# Patient Record
Sex: Female | Born: 1995 | Race: White | Hispanic: Yes | Marital: Married | Smoking: Never smoker
Health system: Southern US, Community
[De-identification: ages and names within clinical notes are randomized; demographics above are authoritative.]

---
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
## 2018-09-15 NOTE — L&D Delivery Note (Signed)
Delivery Note  Date of delivery: 08/27/2019 Estimated Date of Delivery: 08/27/19 Patient's last menstrual period was 11/20/2018 (exact date). EGA: [redacted]w[redacted]d  Delivery Note At 5:12 AM a viable female was delivered via Vaginal, Spontaneous (Presentation: OA).  APGAR: 9, 9; weight  pending.   Placenta status: Spontaneous, Intact with trailing membranes.  Cord: 3 vessels with the following complications:  none.    First Stage: Labor onset: around 1130 Augmentation : AROM and Pitocin Analgesia /Anesthesia intrapartum: none AROM at 2145  [name] presented to L&D with early labor. She was augmented with AROM and pitocin.   Second Stage: Complete dilation at 0510 Onset of pushing at 0515 FHR second stage Cat 1 Delivery at 0512 on 08/27/2019  She progressed to complete quickly and had a spontaneous vaginal birth of a live female over an intact perineum. The fetal head was delivered in OA position with restitution to LOA. No nuchal cord. Anterior then posterior shoulders delivered spontaneously with minimal assistance. Baby placed on mom's abdomen and attended to by transition RN. Cord clamped and cut when pulseless by FOB. Cord blood obtained for newborn labs.  Third Stage: Placenta delivered intact with 3VC at 0523 Placenta disposition: Intact Uterine tone Firm / bleeding min IV pitocin given for hemorrhage prophylaxis  Anesthesia: None Episiotomy: None Lacerations: 1st degree Suture Repair: 3.0 vicryl rapide Est. Blood Loss (mL):    Mom to postpartum.  Baby to Couplet care / Skin to Skin.   Feeding planned: Breastfeeding   Rebecca Powell 08/27/2019, 5:57 AM       Rebecca Powell, CNM 08/27/2019 5:57 AM

## 2019-02-14 ENCOUNTER — Other Ambulatory Visit: Payer: Self-pay | Admitting: Family Medicine

## 2019-02-14 DIAGNOSIS — Z3490 Encounter for supervision of normal pregnancy, unspecified, unspecified trimester: Secondary | ICD-10-CM | POA: Insufficient documentation

## 2019-02-14 DIAGNOSIS — O3680X0 Pregnancy with inconclusive fetal viability, not applicable or unspecified: Secondary | ICD-10-CM

## 2019-02-14 DIAGNOSIS — O99210 Obesity complicating pregnancy, unspecified trimester: Secondary | ICD-10-CM | POA: Insufficient documentation

## 2019-02-14 LAB — HM PAP SMEAR: HM Pap smear: NEGATIVE

## 2019-02-14 LAB — HEPATIC FUNCTION PANEL: AST: 11 — AB (ref 13–35)

## 2019-02-14 LAB — OB RESULTS CONSOLE HGB/HCT, BLOOD
HCT: 36 (ref 29–41)
Hemoglobin: 12.5

## 2019-02-14 LAB — OB RESULTS CONSOLE HIV ANTIBODY (ROUTINE TESTING): HIV: NONREACTIVE

## 2019-02-14 LAB — BASIC METABOLIC PANEL: Creatinine: 0.6 (ref <=1.1)

## 2019-02-14 LAB — OB RESULTS CONSOLE PLATELET COUNT: Platelets: 287

## 2019-02-15 LAB — OB RESULTS CONSOLE ABO/RH: RH Type: POSITIVE

## 2019-02-15 LAB — OB RESULTS CONSOLE TSH: TSH: 0.553

## 2019-02-15 LAB — OB RESULTS CONSOLE ANTIBODY SCREEN: Antibody Screen: NEGATIVE

## 2019-02-15 LAB — OB RESULTS CONSOLE RPR: RPR: NONREACTIVE

## 2019-02-15 LAB — OB RESULTS CONSOLE HEPATITIS B SURFACE ANTIGEN: Hepatitis B Surface Ag: NEGATIVE

## 2019-02-24 ENCOUNTER — Other Ambulatory Visit: Payer: Self-pay | Admitting: Family Medicine

## 2019-02-24 ENCOUNTER — Ambulatory Visit
Admission: RE | Admit: 2019-02-24 | Discharge: 2019-02-24 | Disposition: A | Discharge status: Home / Self Care | Payer: Self-pay | Source: Ambulatory Visit | Attending: Family Medicine | Admitting: Family Medicine

## 2019-02-24 ENCOUNTER — Other Ambulatory Visit: Payer: Self-pay

## 2019-02-24 DIAGNOSIS — O3680X0 Pregnancy with inconclusive fetal viability, not applicable or unspecified: Secondary | ICD-10-CM

## 2019-03-11 DIAGNOSIS — R5081 Fever presenting with conditions classified elsewhere: Secondary | ICD-10-CM | POA: Insufficient documentation

## 2019-04-05 DIAGNOSIS — Z3490 Encounter for supervision of normal pregnancy, unspecified, unspecified trimester: Secondary | ICD-10-CM

## 2019-04-05 DIAGNOSIS — O99210 Obesity complicating pregnancy, unspecified trimester: Secondary | ICD-10-CM

## 2019-04-05 DIAGNOSIS — R5081 Fever presenting with conditions classified elsewhere: Secondary | ICD-10-CM

## 2019-04-05 LAB — GLUCOSE, 1 HOUR GESTATIONAL: Gestational Diabetes Screen: 75

## 2019-04-07 ENCOUNTER — Encounter: Payer: Self-pay | Admitting: Physician Assistant

## 2019-04-07 ENCOUNTER — Ambulatory Visit: Payer: Self-pay | Admitting: Physician Assistant

## 2019-04-07 ENCOUNTER — Other Ambulatory Visit: Payer: Self-pay

## 2019-04-07 DIAGNOSIS — Z3490 Encounter for supervision of normal pregnancy, unspecified, unspecified trimester: Secondary | ICD-10-CM

## 2019-04-07 NOTE — Progress Notes (Signed)
   PRENATAL VISIT NOTE  Subjective:  Rebecca Powell is a 23 y.o. G2P1001 at [redacted]w[redacted]d being seen today for ongoing prenatal care.  She is currently monitored for the following issues for this low-risk pregnancy and has Supervision of normal pregnancy, multiparous, antepartum; Fever presenting with conditions classified elsewhere; and Obesity complicating pregnancy on their problem list.  Patient reports no complaints.  Contractions: Not present. Vag. Bleeding: None.  Movement: Present. Denies leaking of fluid/ROM.   The following portions of the patient's history were reviewed and updated as appropriate: allergies, current medications, past family history, past medical history, past social history, past surgical history and problem list. Problem list updated.  Objective:   Vitals:   04/07/19 1318  BP: 109/69  Temp: 98.8 F (37.1 C)  Weight: 163 lb (73.9 kg)    Fetal Status: Fetal Heart Rate (bpm): 144 Fundal Height: 19 cm Movement: Present     General:  Alert, oriented and cooperative. Patient is in no acute distress.  Skin: Skin is warm and dry. No rash noted.   Cardiovascular: Normal heart rate noted  Respiratory: Normal respiratory effort, no problems with respiration noted  Abdomen: Soft, gravid, appropriate for gestational age.        Pelvic: Cervical exam deferred        Extremities: Normal range of motion.  Edema: None  Mental Status: Normal mood and affect. Normal behavior. Normal judgment and thought content.   Assessment and Plan:  Pregnancy: G2P1001 at [redacted]w[redacted]d  1. Encounter for supervision of normal pregnancy, antepartum, unspecified gravidity Enc pt to push fluids in light of heat advisory. Desires anat [place], will schedule. Declines quad screen today. Agrees to telehealth format for next prenatal visit.   Preterm labor symptoms and general obstetric precautions including but not limited to vaginal bleeding, contractions, leaking of fluid and fetal movement were  reviewed in detail with the patient. Please refer to After Visit Summary for other counseling recommendations.  Return in about 4 weeks (around 05/05/2019) for Routine prenatal care, Telehealth.  Future Appointments  Date Time Provider [place]  05/06/2019  2:00 PM [facility] PROVIDER [facility] None    Lora Havens, PA-C

## 2019-04-07 NOTE — Progress Notes (Signed)
In for visit; taking PNV; discussed quad screen testing-declines Debera Lat, RN

## 2019-04-12 ENCOUNTER — Telehealth: Payer: Self-pay

## 2019-04-12 ENCOUNTER — Other Ambulatory Visit: Payer: Self-pay | Admitting: Physician Assistant

## 2019-04-12 DIAGNOSIS — Z3482 Encounter for supervision of other normal pregnancy, second trimester: Secondary | ICD-10-CM

## 2019-04-12 NOTE — Telephone Encounter (Signed)
Call to client--informed, per A. Streilein, PA, of recommendation to take 81mg  ASA daily; discussed indication to reduce risk of developing preeclampsia;  Client reports has not received call yet about u/s appt.; informed will check and call back; called [facility] u/s scheduling-clarified ordering provider; they will call client and set up appt.

## 2019-04-19 ENCOUNTER — Ambulatory Visit
Admission: RE | Admit: 2019-04-19 | Discharge: 2019-04-19 | Disposition: A | Discharge status: Home / Self Care | Payer: Medicaid Other | Source: Ambulatory Visit | Attending: Physician Assistant | Admitting: Physician Assistant

## 2019-04-19 ENCOUNTER — Other Ambulatory Visit: Payer: Self-pay

## 2019-04-19 DIAGNOSIS — Z3482 Encounter for supervision of other normal pregnancy, second trimester: Secondary | ICD-10-CM | POA: Diagnosis present

## 2019-05-06 ENCOUNTER — Ambulatory Visit: Payer: Self-pay

## 2019-05-06 ENCOUNTER — Ambulatory Visit: Payer: Medicaid Other | Admitting: Family Medicine

## 2019-05-06 ENCOUNTER — Telehealth: Payer: Self-pay

## 2019-05-06 ENCOUNTER — Other Ambulatory Visit: Payer: Self-pay

## 2019-05-06 DIAGNOSIS — Z3490 Encounter for supervision of normal pregnancy, unspecified, unspecified trimester: Secondary | ICD-10-CM

## 2019-05-06 DIAGNOSIS — O99212 Obesity complicating pregnancy, second trimester: Secondary | ICD-10-CM

## 2019-05-06 NOTE — Telephone Encounter (Signed)
Phone call to [facility] [place] scheduler and appt scheduled for 05/10/2019 at 11 am (arrive 10:45 am). [place] at [facility]. Phone call to client with appt and address of [place] facility. [place] interpreted during call. Shona Needles, RN

## 2019-05-06 NOTE — Progress Notes (Signed)
Negative responses to Covid-19 screening questions and denies international travel for self or FOB since pregnant. Does not have thermometer, BP cuff or scales at home. [place] forms completed via phone today. 4 week MHC RV appt scheduled for 917/2020 and client aware. Client is not taking daily Aspirin and states did not know the dose. Counseled to purchase 81 mg strength Aspirin. Shona Needles, RN

## 2019-05-06 NOTE — Progress Notes (Signed)
   TELEPHONE OBSTETRICS VISIT ENCOUNTER NOTE  I connected with@ on 05/06/19 at  2:00 PM EDT by telephone at home and verified that I am speaking with the correct person using two identifiers.   I discussed the limitations, risks, security and privacy concerns of performing an evaluation and management service by telephone and the availability of in person appointments. I also discussed with the patient that there may be a patient responsible charge related to this service. The patient expressed understanding and agreed to proceed.  Subjective:  Rebecca Powell is a 23 y.o. G2P1001 at [redacted]w[redacted]d being followed for ongoing prenatal care.  She is currently monitored for the following issues for this low-risk pregnancy and has Supervision of normal pregnancy, multiparous, antepartum; Fever presenting with conditions classified elsewhere; and Obesity complicating pregnancy on their problem list.  Patient reports no complaints. Reports fetal movement. Denies any contractions, bleeding or leaking of fluid.   The following portions of the patient's history were reviewed and updated as appropriate: allergies, current medications, past family history, past medical history, past social history, past surgical history and problem list.   Objective:   General:  Alert, oriented and cooperative.   Mental Status: Normal mood and affect perceived. Normal judgment and thought content.  Rest of physical exam deferred due to type of encounter  Assessment and Plan:  Pregnancy: G2P1001 at [redacted]w[redacted]d  1. Encounter for supervision of normal pregnancy, antepartum, unspecified gravidity Ordered follow up US to view heart, spine and cord insertion Never got BP Cuff, RX written today for BP cuff- RN to fax.  Next visit at 28 wks for 3rd trimester labs.   2. Obesity affecting pregnancy in second trimester Encouraged ASA    Preterm labor symptoms and general obstetric precautions including but not limited to vaginal  bleeding, contractions, leaking of fluid and fetal movement were reviewed in detail with the patient.   I discussed the assessment and treatment plan with the patient. The patient was provided an opportunity to ask questions and all were answered. The patient agreed with the plan and demonstrated an understanding of the instructions. The patient was advised to call back or seek an in-person office evaluation/go to the hospital for any urgent or concerning symptoms.  Please refer to After Visit Summary for other counseling recommendations.   I provided 12 minutes of non-face-to-face time during this encounter.  No follow-ups on file.  Future Appointments  Date Time Provider [place]  06/02/2019  3:15 PM [facility] PROVIDER AC-MAT None    Caren Macadam, MD

## 2019-05-10 ENCOUNTER — Encounter (INDEPENDENT_AMBULATORY_CARE_PROVIDER_SITE_OTHER): Payer: Self-pay

## 2019-05-10 ENCOUNTER — Other Ambulatory Visit: Payer: Self-pay

## 2019-05-10 ENCOUNTER — Ambulatory Visit
Admission: RE | Admit: 2019-05-10 | Discharge: 2019-05-10 | Disposition: A | Discharge status: Home / Self Care | Payer: Medicaid Other | Source: Ambulatory Visit | Attending: Family Medicine | Admitting: Family Medicine

## 2019-05-10 DIAGNOSIS — Z3490 Encounter for supervision of normal pregnancy, unspecified, unspecified trimester: Secondary | ICD-10-CM | POA: Diagnosis present

## 2019-05-10 DIAGNOSIS — O99212 Obesity complicating pregnancy, second trimester: Secondary | ICD-10-CM | POA: Diagnosis present

## 2019-06-02 ENCOUNTER — Other Ambulatory Visit: Payer: Self-pay

## 2019-06-02 ENCOUNTER — Encounter: Payer: Self-pay | Admitting: Physician Assistant

## 2019-06-02 ENCOUNTER — Ambulatory Visit: Payer: Medicaid Other | Admitting: Physician Assistant

## 2019-06-02 VITALS — BP 122/59 | Temp 98.7°F | Wt 166.4 lb

## 2019-06-02 DIAGNOSIS — Z3490 Encounter for supervision of normal pregnancy, unspecified, unspecified trimester: Secondary | ICD-10-CM

## 2019-06-02 DIAGNOSIS — O99019 Anemia complicating pregnancy, unspecified trimester: Secondary | ICD-10-CM

## 2019-06-02 DIAGNOSIS — Z23 Encounter for immunization: Secondary | ICD-10-CM | POA: Diagnosis not present

## 2019-06-02 DIAGNOSIS — O99212 Obesity complicating pregnancy, second trimester: Secondary | ICD-10-CM

## 2019-06-02 DIAGNOSIS — R5081 Fever presenting with conditions classified elsewhere: Secondary | ICD-10-CM

## 2019-06-02 LAB — OB RESULTS CONSOLE RUBELLA ANTIBODY, IGM: Rubella: IMMUNE

## 2019-06-02 LAB — HIV ANTIBODY (ROUTINE TESTING W REFLEX): HIV 1&2 Ab, 4th Generation: NONREACTIVE

## 2019-06-02 MED ORDER — PREPLUS 27-1 MG PO TABS: 1.0000 | ORAL_TABLET | ORAL | 3 refills | Status: AC

## 2019-06-02 MED ORDER — FERROUS SULFATE 324 (65 FE) MG PO TBEC: 1.0000 | DELAYED_RELEASE_TABLET | Freq: Every day | ORAL | 0 refills | Status: AC

## 2019-06-02 NOTE — Progress Notes (Signed)
122/59 

## 2019-06-02 NOTE — Progress Notes (Signed)
Taking PNV and ASA QD, denies ED/hospital visits since last RV. 28 week labs and Tdap today. Needs Rx for PNV. Hgb 10.9. Patient treated for Anemia per standing order. Hal Morales, RN

## 2019-06-02 NOTE — Progress Notes (Signed)
   PRENATAL VISIT NOTE  Subjective:  Rebecca Powell is a 23 y.o. G2P1001 at [redacted]w[redacted]d being seen today for ongoing prenatal care.  She is currently monitored for the following issues for this low-risk pregnancy and has Supervision of normal pregnancy, multiparous, antepartum; Fever presenting with conditions classified elsewhere; and Obesity complicating pregnancy on their problem list.  Patient reports no complaints.  Contractions: Not present. Vag. Bleeding: None.  Movement: Present. Denies leaking of fluid/ROM.   The following portions of the patient's history were reviewed and updated as appropriate: allergies, current medications, past family history, past medical history, past social history, past surgical history and problem list. Problem list updated.  Objective:   Vitals:   06/02/19 1528  BP: (!) 122/59  Temp: 98.7 F (37.1 C)  Weight: 166 lb 6.4 oz (75.5 kg)    Fetal Status: Fetal Heart Rate (bpm): 144 Fundal Height: 27 cm Movement: Present     General:  Alert, oriented and cooperative. Patient is in no acute distress.  Skin: Skin is warm and dry. No rash noted.   Cardiovascular: Normal heart rate noted  Respiratory: Normal respiratory effort, no problems with respiration noted  Abdomen: Soft, gravid, appropriate for gestational age.  Pain/Pressure: Absent     Pelvic: Cervical exam deferred        Extremities: Normal range of motion.  Edema: None  Mental Status: Normal mood and affect. Normal behavior. Normal judgment and thought content.   Assessment and Plan:  Pregnancy: G2P1001 at [redacted]w[redacted]d  1. Encounter for supervision of normal pregnancy, antepartum, unspecified gravidity eRx refill PNV, Rx BP cuff to be faxed - HIV [place] [facility] - Glucose, 1 hour gestational - RPR - Tdap vaccine greater than or equal to 7yo IM - Hemoglobin, venipuncture - Prenatal Vit-Fe Fumarate-FA (PREPLUS) 27-1 MG TABS; Take 1 tablet by mouth 1 day or 1 dose for 1 dose.  Dispense: 100  tablet; Refill: 3  2. Obesity affecting pregnancy in second trimester Continue daily aspirin   Preterm labor symptoms and general obstetric precautions including but not limited to vaginal bleeding, contractions, leaking of fluid and fetal movement were reviewed in detail with the patient. Please refer to After Visit Summary for other counseling recommendations.  Return in about 2 weeks (around 06/16/2019) for Routine prenatal care, Telehealth.  No future appointments.  Lora Havens, PA-C

## 2019-06-03 LAB — FE+CBC/D/PLT+TIBC+FER+RETIC
Basophils Absolute: 0 10*3/uL (ref 0.0–0.2)
Basos: 0 %
EOS (ABSOLUTE): 0.1 10*3/uL (ref 0.0–0.4)
Eos: 1 %
Ferritin: 16 ng/mL (ref 15–150)
Hematocrit: 31.7 % — ABNORMAL LOW (ref 34.0–46.6)
Hemoglobin: 10.8 g/dL — ABNORMAL LOW (ref 11.1–15.9)
Immature Grans (Abs): 0 10*3/uL (ref 0.0–0.1)
Immature Granulocytes: 0 %
Iron Saturation: 12 % — ABNORMAL LOW (ref 15–55)
Iron: 46 ug/dL (ref 27–159)
Lymphocytes Absolute: 1.4 10*3/uL (ref 0.7–3.1)
Lymphs: 13 %
MCH: 32.7 pg (ref 26.6–33.0)
MCHC: 34.1 g/dL (ref 31.5–35.7)
MCV: 96 fL (ref 79–97)
Monocytes Absolute: 0.8 10*3/uL (ref 0.1–0.9)
Monocytes: 8 %
Neutrophils Absolute: 8.3 10*3/uL — ABNORMAL HIGH (ref 1.4–7.0)
Neutrophils: 78 %
Platelets: 275 10*3/uL (ref 150–450)
RBC: 3.3 x10E6/uL — ABNORMAL LOW (ref 3.77–5.28)
RDW: 11.9 % (ref 11.7–15.4)
Retic Ct Pct: 2.1 % (ref 0.6–2.6)
Total Iron Binding Capacity: 393 ug/dL (ref 250–450)
UIBC: 347 ug/dL (ref 131–425)
WBC: 10.6 10*3/uL (ref 3.4–10.8)

## 2019-06-03 LAB — RPR: RPR Ser Ql: NONREACTIVE

## 2019-06-03 LAB — GLUCOSE, 1 HOUR GESTATIONAL: Gestational Diabetes Screen: 91 mg/dL (ref 65–139)

## 2019-06-03 LAB — HEMOGLOBIN, FINGERSTICK: Hemoglobin: 10.9 g/dL — ABNORMAL LOW (ref 11.1–15.9)

## 2019-06-16 ENCOUNTER — Other Ambulatory Visit: Payer: Self-pay

## 2019-06-16 ENCOUNTER — Ambulatory Visit: Payer: Medicaid Other | Admitting: Advanced Practice Midwife

## 2019-06-16 DIAGNOSIS — Z3490 Encounter for supervision of normal pregnancy, unspecified, unspecified trimester: Secondary | ICD-10-CM

## 2019-06-16 DIAGNOSIS — O99019 Anemia complicating pregnancy, unspecified trimester: Secondary | ICD-10-CM

## 2019-06-16 DIAGNOSIS — O99212 Obesity complicating pregnancy, second trimester: Secondary | ICD-10-CM

## 2019-06-16 MED ORDER — GOODSENSE PRENATAL VITAMINS 28-0.8 MG PO TABS: 1.0000 | ORAL_TABLET | Freq: Once | ORAL | 12 refills | Status: AC

## 2019-06-16 NOTE — Progress Notes (Signed)
   TELEPHONE OBSTETRICS VISIT ENCOUNTER NOTE  I connected with [name] @ on 06/16/19 at  3:00 PM EDT by telephone at home and verified that I am speaking with the correct person using two identifiers.   I discussed the limitations, risks, security and privacy concerns of performing an evaluation and management service by telephone and the availability of in person appointments. I also discussed with the patient that there may be a patient responsible charge related to this service. The patient expressed understanding and agreed to proceed.  Subjective:  Rebecca Powell is a 23 y.o. G2P1001 at [redacted]w[redacted]d being followed for ongoing prenatal care.  She is currently monitored for the following issues for this low-risk pregnancy and has Supervision of normal pregnancy, multiparous, antepartum; Fever presenting with conditions classified elsewhere; Obesity complicating pregnancy FYB=01.7; and Anemia affecting pregnancy, antepartum on their problem list.  Patient reports no complaints. Reports fetal movement. Denies any contractions, bleeding or leaking of fluid.   The following portions of the patient's history were reviewed and updated as appropriate: allergies, current medications, past family history, past medical history, past social history, past surgical history and problem list.   Objective:   General:  Alert, oriented and cooperative.   Mental Status: Normal mood and affect perceived. Normal judgment and thought content.  Rest of physical exam deferred due to type of encounter  Assessment and Plan:  Pregnancy: G2P1001 at [redacted]w[redacted]d 1. Encounter for supervision of normal pregnancy, antepartum, unspecified gravidity Feels well.  Asking for prenatal vits to be sent to pharmacy--done.  Next appt 07/01/19 - Prenatal Vit-Fe Fumarate-FA (GOODSENSE PRENATAL VITAMINS) 28-0.8 MG TABS; Take 1 Bottle by mouth once for 1 dose.  Dispense: 30 tablet; Refill: 12  2. Obesity affecting  pregnancy in second trimester Taking ASA 81 mg daily  3. Anemia affecting pregnancy, antepartum Taking FeSo4 I daily with juice  Preterm labor symptoms and general obstetric precautions including but not limited to vaginal bleeding, contractions, leaking of fluid and fetal movement were reviewed in detail with the patient.  I discussed the assessment and treatment plan with the patient. The patient was provided an opportunity to ask questions and all were answered. The patient agreed with the plan and demonstrated an understanding of the instructions. The patient was advised to call back or seek an in-person office evaluation/go to the hospital for any urgent or concerning symptoms.  Please refer to After Visit Summary for other counseling recommendations.   I provided 8 minutes of non-face-to-face time during this encounter.  No follow-ups on file.  Future Appointments  Date Time Provider [place]  07/01/2019  2:20 PM [facility] PROVIDER AC-[facility] None    Herbie Saxon, CNM

## 2019-06-16 NOTE — Progress Notes (Signed)
TC to patient for maternity telehealth appointment at 40 5/7. Patient identity verified. Patient states she is at home and available to talk at this time. Patient states she does not have a BP cuff or scale at home. Patient taking iron, PNV and ASA daily. Patient scheduled to come in to clinic for next RV on 07/01/2019. Patient states her pharmacy does not have her PNV ready and wonders if the Rx was sent. Patient told we would make sure prescription was sent this afternoon if not already done. Patient states she will be available by phone for provider call for next 30 minutes.Jenetta Downer, RN

## 2019-06-21 NOTE — Addendum Note (Signed)
Addended by: Cletis Media on: 06/21/2019 01:52 PM   Modules accepted: Orders

## 2019-07-01 ENCOUNTER — Ambulatory Visit: Payer: Medicaid Other | Admitting: Advanced Practice Midwife

## 2019-07-01 ENCOUNTER — Other Ambulatory Visit: Payer: Self-pay

## 2019-07-01 VITALS — BP 100/66 | Temp 97.1°F | Wt 167.6 lb

## 2019-07-01 DIAGNOSIS — O99019 Anemia complicating pregnancy, unspecified trimester: Secondary | ICD-10-CM

## 2019-07-01 DIAGNOSIS — Z3490 Encounter for supervision of normal pregnancy, unspecified, unspecified trimester: Secondary | ICD-10-CM | POA: Diagnosis not present

## 2019-07-01 DIAGNOSIS — R5081 Fever presenting with conditions classified elsewhere: Secondary | ICD-10-CM

## 2019-07-01 DIAGNOSIS — O99212 Obesity complicating pregnancy, second trimester: Secondary | ICD-10-CM

## 2019-07-01 DIAGNOSIS — Z23 Encounter for immunization: Secondary | ICD-10-CM | POA: Diagnosis not present

## 2019-07-01 LAB — HEMOGLOBIN, FINGERSTICK: Hemoglobin: 11.2 g/dL (ref 11.1–15.9)

## 2019-07-01 MED ORDER — PRENATAL VITAMINS 28-0.8 MG PO TABS: 1.0000 | ORAL_TABLET | Freq: Every day | ORAL | 4 refills | Status: DC

## 2019-07-01 NOTE — Progress Notes (Signed)
   PRENATAL VISIT NOTE  Subjective:  Rebecca Powell is a 23 y.o. G2P1001 at [redacted]w[redacted]d being seen today for ongoing prenatal care.  She is currently monitored for the following issues for this low-risk pregnancy and has Supervision of normal pregnancy, multiparous, antepartum; Fever presenting with conditions classified elsewhere; Obesity complicating pregnancy ULA=45.3; and Anemia affecting pregnancy, antepartum on their problem list.  Patient reports low abdominal pressure in evenings.  Contractions: Not present. Vag. Bleeding: None.  Movement: Present. Denies leaking of fluid/ROM.   The following portions of the patient's history were reviewed and updated as appropriate: allergies, current medications, past family history, past medical history, past social history, past surgical history and problem list. Problem list updated.  Objective:   Vitals:   07/01/19 1446  BP: 100/66  Temp: (!) 97.1 F (36.2 C)  Weight: 167 lb 9.6 oz (76 kg)    Fetal Status: Fetal Heart Rate (bpm): 150 Fundal Height: 32 cm Movement: Present     General:  Alert, oriented and cooperative. Patient is in no acute distress.  Skin: Skin is warm and dry. No rash noted.   Cardiovascular: Normal heart rate noted  Respiratory: Normal respiratory effort, no problems with respiration noted  Abdomen: Soft, gravid, appropriate for gestational age.  Pain/Pressure: Absent     Pelvic: Cervical exam deferred        Extremities: Normal range of motion.  Edema: None  Mental Status: Normal mood and affect. Normal behavior. Normal judgment and thought content.   Assessment and Plan:  Pregnancy: G2P1001 at [redacted]w[redacted]d   1. Encounter for supervision of normal pregnancy, antepartum, unspecified gravidity Not working.  No car seat yet. UTD - Hemoglobin, venipuncture  2. Obesity affecting pregnancy in second trimester 2 lb 9.6 oz (1.179 kg)   3. Anemia affecting pregnancy, antepartum Taking FeS04 I daily with oj -  Hemoglobin, venipuncture   Preterm labor symptoms and general obstetric precautions including but not limited to vaginal bleeding, contractions, leaking of fluid and fetal movement were reviewed in detail with the patient. Please refer to After Visit Summary for other counseling recommendations.  No follow-ups on file.  No future appointments.  Herbie Saxon, CNM

## 2019-07-01 NOTE — Progress Notes (Addendum)
Here today for 31.6 week MH RV. Taking PNV. Iron and ASA QD. Denies ED/hospital visits since last RV. Kick Count cards and instructions given. Hgb recheck today. Hal Morales, RN Hgb 11.2. Instructed patient to continue taking Iron for one month. Flu vaccine administered and tolerated well. Hal Morales, RN

## 2019-07-14 ENCOUNTER — Ambulatory Visit: Payer: Medicaid Other

## 2019-07-19 ENCOUNTER — Other Ambulatory Visit: Payer: Self-pay

## 2019-07-19 ENCOUNTER — Ambulatory Visit: Payer: Medicaid Other

## 2019-07-19 DIAGNOSIS — O99212 Obesity complicating pregnancy, second trimester: Secondary | ICD-10-CM

## 2019-07-19 DIAGNOSIS — R5081 Fever presenting with conditions classified elsewhere: Secondary | ICD-10-CM

## 2019-07-19 DIAGNOSIS — Z3490 Encounter for supervision of normal pregnancy, unspecified, unspecified trimester: Secondary | ICD-10-CM

## 2019-07-19 LAB — WET PREP FOR TRICH, YEAST, CLUE
Trichomonas Exam: NEGATIVE
Yeast Exam: NEGATIVE

## 2019-07-19 NOTE — Progress Notes (Signed)
In for visit; taking PNV & Fe; taking 81 mg ASA;  c/o low back pain occ.-reviewed comfort measures and discussed belly support items; info. Given on low back exercises; discussed labs needed @ next visit; c/o discharge with odor=>wet prep done; thin discharge, no odor noted, ph=4.5;reviewed wet prep=Negative; reassured client probably normal pregnancy discharge; also c/o occ. Belly button pain when standing-examined abd.-area soft, nontender-advised to monitor and if worsens to call Debera Lat, RN

## 2019-07-19 NOTE — Progress Notes (Signed)
Patient here for MH RV at 34 3/7 weeks.Jenetta Downer, RN

## 2019-08-03 ENCOUNTER — Ambulatory Visit: Payer: Medicaid Other | Admitting: Advanced Practice Midwife

## 2019-08-03 ENCOUNTER — Other Ambulatory Visit: Payer: Self-pay

## 2019-08-03 VITALS — BP 108/67 | Temp 97.9°F | Wt 173.2 lb

## 2019-08-03 DIAGNOSIS — Z3490 Encounter for supervision of normal pregnancy, unspecified, unspecified trimester: Secondary | ICD-10-CM

## 2019-08-03 DIAGNOSIS — O99212 Obesity complicating pregnancy, second trimester: Secondary | ICD-10-CM

## 2019-08-03 DIAGNOSIS — R5081 Fever presenting with conditions classified elsewhere: Secondary | ICD-10-CM

## 2019-08-03 DIAGNOSIS — O99019 Anemia complicating pregnancy, unspecified trimester: Secondary | ICD-10-CM

## 2019-08-03 NOTE — Progress Notes (Signed)
Patient here for MH RV at 36 4/7. Patient counseled to stop taking ASA. 36 week labs and packet today. Patient prefers provider collect.Jenetta Downer, RN

## 2019-08-03 NOTE — Progress Notes (Signed)
   PRENATAL VISIT NOTE  Subjective:  Rebecca Powell is a 23 y.o. G2P1001 at [redacted]w[redacted]d being seen today for ongoing prenatal care.  She is currently monitored for the following issues for this low-risk pregnancy and has Supervision of normal pregnancy, multiparous, antepartum; Fever presenting with conditions classified elsewhere; Obesity complicating pregnancy MQK=86.3; and Anemia affecting pregnancy, antepartum on their problem list.  Patient reports no complaints.  Contractions: Not present. Vag. Bleeding: None.  Movement: Present. Denies leaking of fluid/ROM.   The following portions of the patient's history were reviewed and updated as appropriate: allergies, current medications, past family history, past medical history, past social history, past surgical history and problem list. Problem list updated.  Objective:   Vitals:   08/03/19 1651  BP: 108/67  Temp: 97.9 F (36.6 C)  Weight: 173 lb 3.2 oz (78.6 kg)    Fetal Status: Fetal Heart Rate (bpm): 150 Fundal Height: 37 cm Movement: Present  Presentation: Vertex  General:  Alert, oriented and cooperative. Patient is in no acute distress.  Skin: Skin is warm and dry. No rash noted.   Cardiovascular: Normal heart rate noted  Respiratory: Normal respiratory effort, no problems with respiration noted  Abdomen: Soft, gravid, appropriate for gestational age.  Pain/Pressure: Absent     Pelvic: Cervical exam deferred        Extremities: Normal range of motion.  Edema: None  Mental Status: Normal mood and affect. Normal behavior. Normal judgment and thought content.   Assessment and Plan:  Pregnancy: G2P1001 at [redacted]w[redacted]d  1. Anemia affecting pregnancy, antepartum Taking I FeSo4 daily with juice  2. Encounter for supervision of normal pregnancy, antepartum, unspecified gravidity Has car seat.  Mariea Clonts for [employer].  Taking ASA 81 mg daily--to stop taking now.  Knows when to go to L&D. Reviewed 02/24/19 dating u/s at 14.0 wks.   GC/Chlamydia/GBS cultures done - Chlamydia/GC NAA, Confirmation - GBS Culture  3. Obesity affecting pregnancy in second trimester 8 lb 3.2 oz (3.719 kg)  BMI=33.2     Preterm labor symptoms and general obstetric precautions including but not limited to vaginal bleeding, contractions, leaking of fluid and fetal movement were reviewed in detail with the patient. Please refer to After Visit Summary for other counseling recommendations.  Return in about 1 week (around 08/10/2019) for routine PNC.  Future Appointments  Date Time Provider [place]  08/10/2019  3:40 PM [facility] PROVIDER AC-[facility] None    Herbie Saxon, CNM

## 2019-08-07 LAB — CULTURE, BETA STREP (GROUP B ONLY): Strep Gp B Culture: NEGATIVE

## 2019-08-07 LAB — OB RESULTS CONSOLE GBS: GBS: NEGATIVE

## 2019-08-08 LAB — CHLAMYDIA/GC NAA, CONFIRMATION
Chlamydia trachomatis, NAA: NEGATIVE
Neisseria gonorrhoeae, NAA: NEGATIVE

## 2019-08-10 ENCOUNTER — Other Ambulatory Visit: Payer: Self-pay

## 2019-08-10 ENCOUNTER — Ambulatory Visit: Payer: Medicaid Other | Admitting: Advanced Practice Midwife

## 2019-08-10 VITALS — BP 113/71 | Temp 98.2°F | Wt 173.4 lb

## 2019-08-10 DIAGNOSIS — O99019 Anemia complicating pregnancy, unspecified trimester: Secondary | ICD-10-CM

## 2019-08-10 DIAGNOSIS — Z3490 Encounter for supervision of normal pregnancy, unspecified, unspecified trimester: Secondary | ICD-10-CM

## 2019-08-10 DIAGNOSIS — O99210 Obesity complicating pregnancy, unspecified trimester: Secondary | ICD-10-CM

## 2019-08-10 NOTE — Progress Notes (Signed)
Here today for 37.4 week MH RV. Taking PNV and Iron QD, denies ED/hospital visits since last RV. Hal Morales, RN

## 2019-08-10 NOTE — Progress Notes (Signed)
   PRENATAL VISIT NOTE  Subjective:  Rebecca Powell is a 23 y.o. G2P1001 at [redacted]w[redacted]d being seen today for ongoing prenatal care.  She is currently monitored for the following issues for this low-risk pregnancy and has Supervision of normal pregnancy, multiparous, antepartum; Fever presenting with conditions classified elsewhere; Obesity complicating pregnancy YPP=50.9; and Anemia affecting pregnancy, antepartum on their problem list.  Patient reports no complaints.  Contractions: Not present. Vag. Bleeding: None.  Movement: Present. Denies leaking of fluid/ROM.   The following portions of the patient's history were reviewed and updated as appropriate: allergies, current medications, past family history, past medical history, past social history, past surgical history and problem list. Problem list updated.  Objective:   Vitals:   08/10/19 1536  BP: 113/71  Temp: 98.2 F (36.8 C)  Weight: 173 lb 6.4 oz (78.7 kg)    Fetal Status: Fetal Heart Rate (bpm): 140 Fundal Height: 38 cm Movement: Present  Presentation: Vertex  General:  Alert, oriented and cooperative. Patient is in no acute distress.  Skin: Skin is warm and dry. No rash noted.   Cardiovascular: Normal heart rate noted  Respiratory: Normal respiratory effort, no problems with respiration noted  Abdomen: Soft, gravid, appropriate for gestational age.  Pain/Pressure: Absent     Pelvic: Cervical exam deferred        Extremities: Normal range of motion.  Edema: None  Mental Status: Normal mood and affect. Normal behavior. Normal judgment and thought content.   Assessment and Plan:  Pregnancy: G2P1001 at [redacted]w[redacted]d  1. Obesity affecting pregnancy, antepartum 8 lb 6.4 oz (3.81 kg) Not taking daily ASA anymore  2. Encounter for supervision of normal pregnancy, antepartum, unspecified gravidity Feels well but difficulty sleeping with GERD--suggestions given.  Not working.  Has car seat.  Knows when to go to L&D  3. Anemia  affecting pregnancy, antepartum Taking I FeSo4 daily with oj   Preterm labor symptoms and general obstetric precautions including but not limited to vaginal bleeding, contractions, leaking of fluid and fetal movement were reviewed in detail with the patient. Please refer to After Visit Summary for other counseling recommendations.  No follow-ups on file.  No future appointments.  Herbie Saxon, CNM

## 2019-08-10 NOTE — Progress Notes (Signed)
Attestation of Attending Supervision of clinical support staff: I agree with the care provided to this patient and was available for any consultation.  I have reviewed the RN's note and chart. I was available for consult if needed.   Caren Macadam, MD, MPH, ABFM Attending [place] for [facility]

## 2019-08-17 ENCOUNTER — Other Ambulatory Visit: Payer: Self-pay

## 2019-08-17 ENCOUNTER — Ambulatory Visit: Payer: Medicaid Other | Admitting: Advanced Practice Midwife

## 2019-08-17 VITALS — BP 114/70 | Temp 97.3°F | Wt 174.4 lb

## 2019-08-17 DIAGNOSIS — O99019 Anemia complicating pregnancy, unspecified trimester: Secondary | ICD-10-CM

## 2019-08-17 DIAGNOSIS — O99210 Obesity complicating pregnancy, unspecified trimester: Secondary | ICD-10-CM

## 2019-08-17 DIAGNOSIS — Z3490 Encounter for supervision of normal pregnancy, unspecified, unspecified trimester: Secondary | ICD-10-CM

## 2019-08-17 NOTE — Progress Notes (Signed)
   PRENATAL VISIT NOTE  Subjective:  Rebecca Powell is a 23 y.o. G2P1001 at [redacted]w[redacted]d being seen today for ongoing prenatal care.  She is currently monitored for the following issues for this low-risk pregnancy and has Supervision of normal pregnancy, multiparous, antepartum; Fever presenting with conditions classified elsewhere; Obesity complicating pregnancy VOJ=50.0; and Anemia affecting pregnancy, antepartum on their problem list.  Patient reports no complaints.  Contractions: Not present. Vag. Bleeding: None.  Movement: Present. Denies leaking of fluid/ROM.   The following portions of the patient's history were reviewed and updated as appropriate: allergies, current medications, past family history, past medical history, past social history, past surgical history and problem list. Problem list updated.  Objective:   Vitals:   08/17/19 1557  BP: 114/70  Temp: (!) 97.3 F (36.3 C)  Weight: 174 lb 6.4 oz (79.1 kg)    Fetal Status: Fetal Heart Rate (bpm): 150 Fundal Height: 39 cm Movement: Present  Presentation: Vertex  General:  Alert, oriented and cooperative. Patient is in no acute distress.  Skin: Skin is warm and dry. No rash noted.   Cardiovascular: Normal heart rate noted  Respiratory: Normal respiratory effort, no problems with respiration noted  Abdomen: Soft, gravid, appropriate for gestational age.  Pain/Pressure: Absent     Pelvic: Cervical exam deferred        Extremities: Normal range of motion.  Edema: None  Mental Status: Normal mood and affect. Normal behavior. Normal judgment and thought content.   Assessment and Plan:  Pregnancy: G2P1001 at [redacted]w[redacted]d  1. Encounter for supervision of normal pregnancy, antepartum, unspecified gravidity Feels well.  Ready for baby at home.  Has car seat.  Knows when to go to L&D  2. Obesity affecting pregnancy, antepartum 9 lb 6.4 oz (4.264 kg)   3. Anemia affecting pregnancy, antepartum Taking I FeSo4 daily with  juice   Preterm labor symptoms and general obstetric precautions including but not limited to vaginal bleeding, contractions, leaking of fluid and fetal movement were reviewed in detail with the patient. Please refer to After Visit Summary for other counseling recommendations.  No follow-ups on file.  Future Appointments  Date Time Provider [place]  08/24/2019  3:20 PM [facility] PROVIDER [facility] None    Herbie Saxon, CNM

## 2019-08-17 NOTE — Progress Notes (Signed)
Here today for 38.4 week MH RV. Taking PNV and Iron QD. Denies ED/hosptial visits since last RV. Dr. Renae Fickle chosen as pediatrician. Hal Morales, RN

## 2019-08-24 ENCOUNTER — Ambulatory Visit: Payer: Medicaid Other | Admitting: Advanced Practice Midwife

## 2019-08-24 ENCOUNTER — Other Ambulatory Visit: Payer: Self-pay

## 2019-08-24 VITALS — BP 114/76 | Temp 97.4°F | Wt 174.2 lb

## 2019-08-24 DIAGNOSIS — O99019 Anemia complicating pregnancy, unspecified trimester: Secondary | ICD-10-CM

## 2019-08-24 DIAGNOSIS — Z3490 Encounter for supervision of normal pregnancy, unspecified, unspecified trimester: Secondary | ICD-10-CM

## 2019-08-24 DIAGNOSIS — O99213 Obesity complicating pregnancy, third trimester: Secondary | ICD-10-CM

## 2019-08-24 NOTE — Progress Notes (Signed)
   PRENATAL VISIT NOTE  Subjective:  Rebecca Powell is a 23 y.o. G2P1001 at [redacted]w[redacted]d being seen today for ongoing prenatal care.  She is currently monitored for the following issues for this low-risk pregnancy and has Supervision of normal pregnancy, multiparous, antepartum; Fever presenting with conditions classified elsewhere; Obesity complicating pregnancy WUJ=81.1; and Anemia affecting pregnancy, antepartum on their problem list.  Patient reports no complaints.  Contractions: Not present. Vag. Bleeding: None.  Movement: Present. Denies leaking of fluid/ROM.   The following portions of the patient's history were reviewed and updated as appropriate: allergies, current medications, past family history, past medical history, past social history, past surgical history and problem list. Problem list updated.  Objective:   Vitals:   08/24/19 1525  BP: 114/76  Temp: (!) 97.4 F (36.3 C)  Weight: 174 lb 3.2 oz (79 kg)    Fetal Status: Fetal Heart Rate (bpm): 150 Fundal Height: 39 cm Movement: Present  Presentation: Vertex  General:  Alert, oriented and cooperative. Patient is in no acute distress.  Skin: Skin is warm and dry. No rash noted.   Cardiovascular: Normal heart rate noted  Respiratory: Normal respiratory effort, no problems with respiration noted  Abdomen: Soft, gravid, appropriate for gestational age.  Pain/Pressure: Absent     Pelvic: Cervical exam performed        Extremities: Normal range of motion.  Edema: None  Mental Status: Normal mood and affect. Normal behavior. Normal judgment and thought content.   Assessment and Plan:  Pregnancy: G2P1001 at [redacted]w[redacted]d  1. Obesity affecting pregnancy in third trimester 9 lb 3.2 oz (4.173 kg)   2. Anemia affecting pregnancy, antepartum Taking I FeSo4 daily seperately from vits  3. Encounter for supervision of normal pregnancy, antepartum, unspecified gravidity Ready for baby at home.  Has car seat.  Knows when to go to L&D.   Pt desires IIOL 09/03/19--paperwork completed Not taking ASA 81 mg anymore   Term labor symptoms and general obstetric precautions including but not limited to vaginal bleeding, contractions, leaking of fluid and fetal movement were reviewed in detail with the patient. Please refer to After Visit Summary for other counseling recommendations.  Return in about 1 week (around 08/31/2019) for routine PNC.  No future appointments.  Herbie Saxon, CNM

## 2019-08-24 NOTE — Progress Notes (Addendum)
Here today for 39.4 week MH RV. Taking PNV and Iron QD. Denies ED/hospital visits since last RV. Needs cervical check and IOL form today. Hal Morales, RN

## 2019-08-26 ENCOUNTER — Inpatient Hospital Stay
Admission: EM | Admit: 2019-08-26 | Discharge: 2019-08-29 | DRG: 806 | Disposition: A | Discharge status: Home / Self Care | Payer: Medicaid Other | Attending: Certified Nurse Midwife | Admitting: Certified Nurse Midwife

## 2019-08-26 ENCOUNTER — Encounter: Payer: Self-pay | Admitting: Obstetrics & Gynecology

## 2019-08-26 ENCOUNTER — Other Ambulatory Visit: Payer: Self-pay

## 2019-08-26 ENCOUNTER — Telehealth: Payer: Self-pay

## 2019-08-26 DIAGNOSIS — Z3483 Encounter for supervision of other normal pregnancy, third trimester: Secondary | ICD-10-CM | POA: Diagnosis not present

## 2019-08-26 DIAGNOSIS — O43123 Velamentous insertion of umbilical cord, third trimester: Principal | ICD-10-CM | POA: Diagnosis present

## 2019-08-26 DIAGNOSIS — E669 Obesity, unspecified: Secondary | ICD-10-CM | POA: Diagnosis present

## 2019-08-26 DIAGNOSIS — O99214 Obesity complicating childbirth: Secondary | ICD-10-CM | POA: Diagnosis present

## 2019-08-26 DIAGNOSIS — O9081 Anemia of the puerperium: Secondary | ICD-10-CM | POA: Diagnosis not present

## 2019-08-26 DIAGNOSIS — Z3A39 39 weeks gestation of pregnancy: Secondary | ICD-10-CM

## 2019-08-26 DIAGNOSIS — Z20828 Contact with and (suspected) exposure to other viral communicable diseases: Secondary | ICD-10-CM | POA: Diagnosis present

## 2019-08-26 DIAGNOSIS — D62 Acute posthemorrhagic anemia: Secondary | ICD-10-CM | POA: Diagnosis not present

## 2019-08-26 DIAGNOSIS — M25569 Pain in unspecified knee: Secondary | ICD-10-CM | POA: Diagnosis present

## 2019-08-26 DIAGNOSIS — R5081 Fever presenting with conditions classified elsewhere: Secondary | ICD-10-CM

## 2019-08-26 LAB — RESPIRATORY PANEL BY RT PCR (FLU A&B, COVID)
Influenza A by PCR: NEGATIVE
Influenza B by PCR: NEGATIVE
SARS Coronavirus 2 by RT PCR: NEGATIVE

## 2019-08-26 LAB — CBC
HCT: 35.6 % — ABNORMAL LOW (ref 36.0–46.0)
Hemoglobin: 11.9 g/dL — ABNORMAL LOW (ref 12.0–15.0)
MCH: 32.4 pg (ref 26.0–34.0)
MCHC: 33.4 g/dL (ref 30.0–36.0)
MCV: 97 fL (ref 80.0–100.0)
Platelets: 160 10*3/uL (ref 150–400)
RBC: 3.67 MIL/uL — ABNORMAL LOW (ref 3.87–5.11)
RDW: 13.1 % (ref 11.5–15.5)
WBC: 7.7 10*3/uL (ref 4.0–10.5)
nRBC: 0 % (ref 0.0–0.2)

## 2019-08-26 LAB — TYPE AND SCREEN
ABO/RH(D): O POS
Antibody Screen: NEGATIVE

## 2019-08-26 LAB — OB RESULTS CONSOLE GC/CHLAMYDIA
Chlamydia: NEGATIVE
Gonorrhea: NEGATIVE

## 2019-08-26 MED ORDER — LACTATED RINGERS IV SOLN
INTRAVENOUS | Status: DC
  Administered 2019-08-26: 23:00:00 via INTRAVENOUS

## 2019-08-26 MED ORDER — MISOPROSTOL 200 MCG PO TABS
ORAL_TABLET | ORAL | Status: AC
  Filled 2019-08-26: qty 4

## 2019-08-26 MED ORDER — LACTATED RINGERS IV SOLN
500.0000 mL | Freq: Once | INTRAVENOUS | Status: AC
  Administered 2019-08-26: 13:00:00 1000 mL via INTRAVENOUS

## 2019-08-26 MED ORDER — LIDOCAINE HCL (PF) 1 % IJ SOLN
30.0000 mL | INTRAMUSCULAR | Status: AC | PRN
  Administered 2019-08-27: 30 mL via SUBCUTANEOUS

## 2019-08-26 MED ORDER — LACTATED RINGERS IV SOLN: 500.0000 mL | INTRAVENOUS | Status: DC | PRN

## 2019-08-26 MED ORDER — OXYTOCIN 40 UNITS IN NORMAL SALINE INFUSION - SIMPLE MED
2.5000 [IU]/h | INTRAVENOUS | Status: DC
  Filled 2019-08-26: qty 1000

## 2019-08-26 MED ORDER — OXYCODONE-ACETAMINOPHEN 5-325 MG PO TABS: 1.0000 | ORAL_TABLET | ORAL | Status: DC | PRN

## 2019-08-26 MED ORDER — ACETAMINOPHEN 325 MG PO TABS
ORAL_TABLET | ORAL | Status: AC
  Filled 2019-08-26: qty 2

## 2019-08-26 MED ORDER — SOD CITRATE-CITRIC ACID 500-334 MG/5ML PO SOLN: 30.0000 mL | ORAL | Status: DC | PRN

## 2019-08-26 MED ORDER — OXYCODONE-ACETAMINOPHEN 5-325 MG PO TABS: 2.0000 | ORAL_TABLET | ORAL | Status: DC | PRN

## 2019-08-26 MED ORDER — AMMONIA AROMATIC IN INHA
RESPIRATORY_TRACT | Status: AC
  Filled 2019-08-26: qty 10

## 2019-08-26 MED ORDER — LACTATED RINGERS IV SOLN: INTRAVENOUS | Status: DC

## 2019-08-26 MED ORDER — LIDOCAINE HCL (PF) 1 % IJ SOLN
INTRAMUSCULAR | Status: AC
  Filled 2019-08-26: qty 30

## 2019-08-26 MED ORDER — OXYTOCIN 40 UNITS IN NORMAL SALINE INFUSION - SIMPLE MED
INTRAVENOUS | Status: AC
  Filled 2019-08-26: qty 1000

## 2019-08-26 MED ORDER — OXYTOCIN BOLUS FROM INFUSION
500.0000 mL | Freq: Once | INTRAVENOUS | Status: AC
  Administered 2019-08-27: 500 mL via INTRAVENOUS

## 2019-08-26 MED ORDER — OXYTOCIN 10 UNIT/ML IJ SOLN
INTRAMUSCULAR | Status: AC
  Filled 2019-08-26: qty 2

## 2019-08-26 MED ORDER — ONDANSETRON HCL 4 MG/2ML IJ SOLN: 4.0000 mg | Freq: Four times a day (QID) | INTRAMUSCULAR | Status: DC | PRN

## 2019-08-26 MED ORDER — ACETAMINOPHEN 325 MG PO TABS
650.0000 mg | ORAL_TABLET | ORAL | Status: DC | PRN
  Administered 2019-08-26 (×2): 650 mg via ORAL
  Filled 2019-08-26: qty 2

## 2019-08-26 NOTE — OB Triage Note (Signed)
Pt presents with recent fall yesterday on her knees. Reports still feeling baby move. This am she used the bathroom and noticed some pink tinge from her vaginal area. She is having contractions every 4 mins, and rates them 2/10, however her left knee pain from the fall is a 7/10.

## 2019-08-26 NOTE — Progress Notes (Signed)
Labor Progress Note  Rebecca Powell is a 23 y.o. G2P1001 at [redacted]w[redacted]d admitted for active labor  Subjective: Pt is feeling UC more intensely but they are still far apart.   Objective: BP 136/83 (BP Location: Left Arm)   Pulse 67   Temp 98 F (36.7 C) (Oral)   Resp 16   Ht 5\' 4"  (1.626 m)   Wt 78 kg   LMP 11/20/2018 (Exact Date)   BMI 29.52 kg/m   Fetal Assessment: FHT:  FHR: 140 bpm, variability: moderate,  accelerations:  Present,  decelerations:  Absent Category/reactivity:  Category I UC:   irregular SVE:   deferred Membrane status: AROM Amniotic color: Clear  Labs: Lab Results  Component Value Date   WBC 7.7 08/26/2019   HGB 11.9 (L) 08/26/2019   HCT 35.6 (L) 08/26/2019   MCV 97.0 08/26/2019   PLT 160 08/26/2019    Assessment / Plan: Augmentation of labor, progressing well  Labor: 2145 4/70/-2 AROM'd clear fluid tolerated well 2230 pt reported gush Discussed R/B of starting pitocin for labor augmentation. Pt verbalized understanding and is agreeable Preeclampsia:  BP 136/83 Fetal Wellbeing:  Category I Pain Control:  Labor support without medications I/D:  GBS and Covid Neg Anticipated MOD:  NSVD  Interpreter used   [employer], CNM 08/26/2019, 11:16 PM

## 2019-08-26 NOTE — H&P (Signed)
HISTORY AND PHYSICAL  HISTORY OF PRESENT ILLNESS: Ms. Rebecca Powell is a 23 y.o. G2P1001 at [redacted]w[redacted]d presenting with a fall at home onto her knees yesterday afternoon, came into the ER this morning for knee pain and they moved her to L&D after she reported seeing pink spotting this morning.  She has been having contractions Q32min and denies leakage of fluid, vaginal bleeding, or decreased fetal movement.   Prenatal care site:[facility]                                                        Pregnancy complicated by: obesity and anemia  REVIEW OF SYSTEMS: A complete review of systems was performed and was specifically negative for headache, changes in vision, RUQ pain, shortness of breath, chest pain, lower extremity edema and dysuria.   HISTORY:  History reviewed. No pertinent past medical history.  History reviewed. No pertinent surgical history.  No current facility-administered medications on file prior to encounter.   Current Outpatient Medications on File Prior to Encounter  Medication Sig Dispense Refill  . aspirin EC 81 MG tablet Take 81 mg by mouth daily.    . ferrous sulfate 324 (65 Fe) MG TBEC Take 1 tablet (325 mg total) by mouth daily. 100 tablet 0  . Prenatal Vit-Fe Fumarate-FA (MULTIVITAMIN-PRENATAL) 27-0.8 MG TABS tablet Take 1 tablet by mouth daily at 12 noon.    . Prenatal Vit-Fe Fumarate-FA (PRENATAL VITAMINS) 28-0.8 MG TABS Take 1 tablet by mouth daily. (Patient not taking: Reported on 07/19/2019) 30 tablet 4  . promethazine (PHENERGAN) 25 MG tablet Take 25 mg by mouth every 6 (six) hours as needed for nausea or vomiting.       No Known Allergies  OB History  Gravida Para Term Preterm AB Living  2 1 1  0   1  SAB TAB Ectopic Multiple Live Births          1    # Outcome Date GA Lbr Len/2nd Weight Sex Delivery Anes PTL Lv  2 Current           1 Term 05/10/10 [redacted]w[redacted]d  3200 g M Vag-Spont  Y LIV     Social History   Tobacco Use  . Smoking status: Never Smoker  .  Smokeless tobacco: Never Used  Substance Use Topics  . Alcohol use: Not on file  . Drug use: Not on file    PHYSICAL EXAM: Temp:  [97.8 F (36.6 C)-98 F (36.7 C)] 98 F (36.7 C) (12/11 1923) Pulse Rate:  [72-80] 72 (12/11 1923) Resp:  [12-14] 14 (12/11 1923) BP: (120-132)/(73-88) 126/88 (12/11 1923) Weight:  [78 kg] 78 kg (12/11 1138)  GENERAL: NAD AAOx3 CHEST:CTAB no increased work of breathing CV:RRR  ABDOMEN: gravid, nontender EXTREMITIES:  Warm and well-perfused, nontender, nonedematous CERVIX: 3/ 60/ -3   FHT:150s baseline with mod variability, accelerations present and no decelerations  Toco: irregular  DIAGNOSTIC STUDIES: Recent Labs  Lab 08/26/19 1759  WBC 7.7  HGB 11.9*  HCT 35.6*  PLT 160    PRENATAL STUDIES:  Prenatal Labs:  MBT: O pos; Rubella immune, Varicella unknown, HIV neg, RPR neg, Hep B neg, GC/CT neg, GBS neg, glucola 91  Last 14/11/20 normal anatomy limited view of spine  ASSESSMENT AND PLAN:  1. Fetal Well being  - Fetal Tracing: Category  1 - Ultrasound: Prenatal records limited however they do say anatomy scan was WNL with poor views of spine, heart, and cord insertion.  - Group B Streptococcus: neg - Presentation: Vtx confirmed by SVE   2. Routine OB: - Prenatal labs reviewed, as above - Rh pos  3. Induction of Labor:  -  Contractions irregular external toco in place -  Pelvis proven to 3200g -  Plan for induction with AROM  4. Post Partum Planning: - Infant feeding: Breast - Contraception: Condoms  Rebecca Powell, CNM 08/26/2019 7:44 PM

## 2019-08-26 NOTE — Telephone Encounter (Signed)
TC from patient, interpreter M. [place]). Patient is 47 6/7 today. She called stating she had some "pink water" this morning when she went to the bathroom. Patient denies contractions, but states she is having "cramps", especially when she lies down (about 15 minutes after lying down) and denies any more discharge since the "pink water". However, patient states that her underpants are wet. Patient states she fell yesterday, on her knees and her foot hurts. She states baby is moving as usual. Per consult with provider, E. Sciora, patient counseled to go the the hospital this morning to be checked for ruptured membranes, also due to fall yesterday. Patient states understanding and states she will go to the hospital. .Jenetta Downer, RN

## 2019-08-27 ENCOUNTER — Other Ambulatory Visit: Payer: Self-pay

## 2019-08-27 ENCOUNTER — Encounter: Payer: Self-pay | Admitting: Obstetrics and Gynecology

## 2019-08-27 DIAGNOSIS — Z3483 Encounter for supervision of other normal pregnancy, third trimester: Secondary | ICD-10-CM | POA: Diagnosis not present

## 2019-08-27 DIAGNOSIS — O9081 Anemia of the puerperium: Secondary | ICD-10-CM | POA: Diagnosis present

## 2019-08-27 LAB — CBC WITH DIFFERENTIAL/PLATELET
Abs Immature Granulocytes: 0.11 10*3/uL — ABNORMAL HIGH (ref 0.00–0.07)
Basophils Absolute: 0 10*3/uL (ref 0.0–0.1)
Basophils Relative: 0 %
Eosinophils Absolute: 0 10*3/uL (ref 0.0–0.5)
Eosinophils Relative: 0 %
HCT: 31.7 % — ABNORMAL LOW (ref 36.0–46.0)
Hemoglobin: 10.7 g/dL — ABNORMAL LOW (ref 12.0–15.0)
Immature Granulocytes: 1 %
Lymphocytes Relative: 5 %
Lymphs Abs: 1.1 10*3/uL (ref 0.7–4.0)
MCH: 32.5 pg (ref 26.0–34.0)
MCHC: 33.8 g/dL (ref 30.0–36.0)
MCV: 96.4 fL (ref 80.0–100.0)
Monocytes Absolute: 1 10*3/uL (ref 0.1–1.0)
Monocytes Relative: 5 %
Neutro Abs: 18.3 10*3/uL — ABNORMAL HIGH (ref 1.7–7.7)
Neutrophils Relative %: 89 %
Platelets: 167 10*3/uL (ref 150–400)
RBC: 3.29 MIL/uL — ABNORMAL LOW (ref 3.87–5.11)
RDW: 13 % (ref 11.5–15.5)
WBC: 20.5 10*3/uL — ABNORMAL HIGH (ref 4.0–10.5)
nRBC: 0 % (ref 0.0–0.2)

## 2019-08-27 LAB — RPR: RPR Ser Ql: NONREACTIVE

## 2019-08-27 MED ORDER — DOCUSATE SODIUM 100 MG PO CAPS
100.0000 mg | ORAL_CAPSULE | Freq: Two times a day (BID) | ORAL | Status: DC
  Administered 2019-08-27 – 2019-08-29 (×4): 100 mg via ORAL
  Filled 2019-08-27 (×4): qty 1

## 2019-08-27 MED ORDER — IBUPROFEN 600 MG PO TABS
ORAL_TABLET | ORAL | Status: AC
  Filled 2019-08-27: qty 1

## 2019-08-27 MED ORDER — DIBUCAINE (PERIANAL) 1 % EX OINT: 1.0000 "application " | TOPICAL_OINTMENT | CUTANEOUS | Status: DC | PRN

## 2019-08-27 MED ORDER — WITCH HAZEL-GLYCERIN EX PADS: 1.0000 "application " | MEDICATED_PAD | CUTANEOUS | Status: DC | PRN

## 2019-08-27 MED ORDER — PRENATAL MULTIVITAMIN CH: 1.0000 | ORAL_TABLET | Freq: Every day | ORAL | Status: DC

## 2019-08-27 MED ORDER — COCONUT OIL OIL
1.0000 "application " | TOPICAL_OIL | Status: DC | PRN
  Filled 2019-08-27 (×2): qty 120

## 2019-08-27 MED ORDER — BENZOCAINE-MENTHOL 20-0.5 % EX AERO
1.0000 "application " | INHALATION_SPRAY | CUTANEOUS | Status: DC | PRN
  Filled 2019-08-27: qty 56

## 2019-08-27 MED ORDER — SIMETHICONE 80 MG PO CHEW: 80.0000 mg | CHEWABLE_TABLET | ORAL | Status: DC | PRN

## 2019-08-27 MED ORDER — IBUPROFEN 600 MG PO TABS
600.0000 mg | ORAL_TABLET | Freq: Four times a day (QID) | ORAL | Status: DC
  Administered 2019-08-27 – 2019-08-29 (×8): 600 mg via ORAL
  Filled 2019-08-27 (×8): qty 1

## 2019-08-27 MED ORDER — BENZOCAINE-MENTHOL 20-0.5 % EX AERO
INHALATION_SPRAY | CUTANEOUS | Status: AC
  Filled 2019-08-27: qty 56

## 2019-08-27 MED ORDER — ONDANSETRON HCL 4 MG/2ML IJ SOLN: 4.0000 mg | INTRAMUSCULAR | Status: DC | PRN

## 2019-08-27 MED ORDER — FERROUS SULFATE 325 (65 FE) MG PO TABS
325.0000 mg | ORAL_TABLET | Freq: Two times a day (BID) | ORAL | Status: DC
  Administered 2019-08-27 – 2019-08-29 (×4): 325 mg via ORAL
  Filled 2019-08-27 (×4): qty 1

## 2019-08-27 MED ORDER — OXYTOCIN 40 UNITS IN NORMAL SALINE INFUSION - SIMPLE MED
1.0000 m[IU]/min | INTRAVENOUS | Status: DC
  Administered 2019-08-27: 2 m[IU]/min via INTRAVENOUS

## 2019-08-27 MED ORDER — ACETAMINOPHEN 325 MG PO TABS: 650.0000 mg | ORAL_TABLET | ORAL | Status: DC | PRN

## 2019-08-27 MED ORDER — FENTANYL CITRATE (PF) 100 MCG/2ML IJ SOLN
INTRAMUSCULAR | Status: AC
  Filled 2019-08-27: qty 2

## 2019-08-27 MED ORDER — ONDANSETRON HCL 4 MG PO TABS: 4.0000 mg | ORAL_TABLET | ORAL | Status: DC | PRN

## 2019-08-27 MED ORDER — FENTANYL CITRATE (PF) 100 MCG/2ML IJ SOLN
50.0000 ug | INTRAMUSCULAR | Status: DC | PRN
  Administered 2019-08-27 (×2): 100 ug via INTRAVENOUS
  Filled 2019-08-27: qty 2

## 2019-08-27 MED ORDER — DIPHENHYDRAMINE HCL 25 MG PO CAPS: 25.0000 mg | ORAL_CAPSULE | Freq: Four times a day (QID) | ORAL | Status: DC | PRN

## 2019-08-27 MED ORDER — TETANUS-DIPHTH-ACELL PERTUSSIS 5-2.5-18.5 LF-MCG/0.5 IM SUSP: 0.5000 mL | Freq: Once | INTRAMUSCULAR | Status: DC

## 2019-08-27 MED ORDER — TERBUTALINE SULFATE 1 MG/ML IJ SOLN: 0.2500 mg | Freq: Once | INTRAMUSCULAR | Status: DC | PRN

## 2019-08-27 NOTE — Discharge Summary (Signed)
Obstetrical Discharge Summary  Patient Name: Rebecca Powell DOB: 1995-12-06 MRN: [id number]  Date of Admission: 08/26/2019 Date of Delivery: 08/27/2019 @ 0512 Delivered by: Rebecca Powell Date of Discharge: 08/29/2019  Primary OB: [facility] KGM:[id number]'D last menstrual period was 11/20/2018 (exact date). EDC Estimated Date of Delivery: 08/27/19 Gestational Age at Delivery: [redacted]w[redacted]d   Antepartum complications: Obesity, Anemia Admitting Diagnosis: Early labor Secondary Diagnosis: Patient Active Problem List   Diagnosis Date Noted  . NSVD (normal spontaneous vaginal delivery) 08/27/2019  . Anemia, postpartum 08/27/2019  . First degree laceration of perineum during delivery, postpartum 08/27/2019  . Fever presenting with conditions classified elsewhere 03/11/2019  . Obesity complicating pregnancy Rebecca Powell=40.3 02/14/2019    Augmentation: AROM and Pitocin Complications: None  Intrapartum complications/course:  Delivery Type: spontaneous vaginal delivery Anesthesia: None Placenta: spontaneous Laceration: 1st degree lac Episiotomy: none Newborn Data: Live born female  Birth Weight:  7#13.9 3570g APGAR: 9, 9  Newborn Delivery   Birth date/time: 08/27/2019 05:12:00 Delivery type: Vaginal, Spontaneous      24yo G4P1021 at 39+4wks presenting to the ER with knee pain after a fall.  She reported that the day after her fall she had pink spotting.  On arrival to L&D contractions began getting painful. Pt was admitted in labor and AROM with clear fluid.  Pitocin was started and she progressed to complete and pushed over an intact perineum and delivered the fetal head, followed promptly by the shoulders. She was in control the whole time, and the baby placed on the maternal abdomen. Delayed cord clamping and the FOB cut his cord, while she was skin to skin. The placenta delivered spontaneously and intact, with trailing membranes. Small 1st degree laceration that was repaired with 3-0 vicryl  rapide.  Mom and baby tolerated the procedure well.   Postpartum Procedures: none  Post partum course:  Patient had an uncomplicated postpartum course.  By time of discharge on PPD#2, her pain was controlled on oral pain medications; she had appropriate lochia and was ambulating, voiding without difficulty and tolerating regular diet.  She was deemed stable for discharge to home.       Discharge Physical Exam:  BP 122/81 (BP Location: Left Arm)   Pulse 74   Temp 98.6 F (37 C) (Oral)   Resp 18   Ht 5\' 4"  (1.626 m)   Wt 78 kg   LMP 11/20/2018 (Exact Date)   SpO2 99%   Breastfeeding Unknown   BMI 29.52 kg/m   General: alert and no distress Pulm: normal respiratory effort Lochia: appropriate Abdomen: soft, NT Uterine Fundus: firm, below umbilicus Extremities: No evidence of DVT seen on physical exam. No lower extremity edema. Edinburgh:   Labs: CBC Latest Ref Rng & Units 08/28/2019 08/27/2019 08/26/2019  WBC 4.0 - 10.5 K/uL 10.3 20.5(H) 7.7  Hemoglobin 12.0 - 15.0 g/dL 9.2(L) 10.7(L) 11.9(L)  Hematocrit 36.0 - 46.0 % 26.6(L) 31.7(L) 35.6(L)  Platelets 150 - 400 K/uL 142(L) 167 160   O POS Hemoglobin  Date Value Ref Range Status  08/28/2019 9.2 (L) 12.0 - 15.0 g/dL Final  06/02/2019 10.8 (L) 11.1 - 15.9 g/dL Final  02/14/2019 12.5  Final   HCT  Date Value Ref Range Status  08/28/2019 26.6 (L) 36.0 - 46.0 % Final  02/14/2019 36 29 - 41 Final   Hematocrit  Date Value Ref Range Status  06/02/2019 31.7 (L) 34.0 - 46.6 % Final    Disposition: stable, discharge to home Baby Feeding: breastmilk Baby Disposition: home with mom  Contraception: TBD- pt undecided  Prenatal Labs:  Blood type/Rh O pos  Antibody screen neg  Rubella Immune  Varicella unknown  RPR NR  HBsAg Neg  HIV NR  GC neg  Chlamydia neg  Genetic screening   1 hour GTT 91  3 hour GTT   GBS  negative   Rh Immune globulin given: n/a Rubella vaccine given: n/a Varicella vaccine given: status  unknown, pt undecided about vaccine Tdap vaccine given in AP or PP setting: 06/02/19 Flu vaccine given in AP or PP setting: 07/01/19  Plan: Rebecca Powell was discharged to home in good condition. Follow-up appointment with delivering provider in 6 weeks.  Discharge Instructions: Per After Visit Summary. Activity: Advance as tolerated. Pelvic rest for 6 weeks.   Diet: Regular Discharge Medications: Allergies as of 08/29/2019   No Known Allergies     Medication List    STOP taking these medications   aspirin EC 81 MG tablet   promethazine 25 MG tablet Commonly known as: PHENERGAN     TAKE these medications   acetaminophen 325 MG tablet Commonly known as: Tylenol Take 2 tablets (650 mg total) by mouth every 4 (four) hours as needed (for pain scale < 4).   docusate sodium 100 MG capsule Commonly known as: COLACE Take 1 capsule (100 mg total) by mouth 2 (two) times daily.   ferrous sulfate 324 (65 Fe) MG Tbec Take 1 tablet (325 mg total) by mouth daily.   ibuprofen 600 MG tablet Commonly known as: ADVIL Take 1 tablet (600 mg total) by mouth every 6 (six) hours.   multivitamin-prenatal 27-0.8 MG Tabs tablet Take 1 tablet by mouth daily at 12 noon. What changed: Another medication with the same name was removed. Continue taking this medication, and follow the directions you see here.      Outpatient follow up:  Follow-up Information    Haroldine Laws, CNM. Schedule an appointment as soon as possible for a visit in 6 week(s).   Specialty: Certified Nurse Midwife Contact information: [address] [place] [address] [phone]           Signed: Randa Ngo, CNM 08/29/2019  1:10 PM

## 2019-08-27 NOTE — Discharge Instructions (Signed)

## 2019-08-27 NOTE — Progress Notes (Signed)
Labor Progress Note  Rebecca Powell is a 23 y.o. G2P1001 at [redacted]w[redacted]d admitted for active labor  Subjective: Pt reports UCs are stronger and is requesting pain medication  Objective: BP 136/83 (BP Location: Left Arm)   Pulse 67   Temp 98 F (36.7 C) (Oral)   Resp 16   Ht 5\' 4"  (1.626 m)   Wt 78 kg   LMP 11/20/2018 (Exact Date)   BMI 29.52 kg/m    Fetal Assessment: FHT:  FHR: 140 bpm, variability: moderate,  accelerations:  Present,  decelerations:  Absent Category/reactivity:  Category I UC:   irregular SVE:    Dilation: 4 cm  Effacement: 75%  Station:  -2  Consistency: soft  Position: middle  Membrane status:ROM Amniotic color: Clear  Assessment / Plan: Augmentation of labor, progressing well  Labor: 0015 4/70/-2 Pitocin initiated 2145 4/70/-2 AROM'd clear fluid tolerated well Preeclampsia:  BP 136/83 Fetal Wellbeing:  Category I Pain Control:  Fentanyl ordered I/D:  GBS and Covid Neg Anticipated MOD:  NSVD  Interpreter used    [employer], CNM 08/27/2019, 12:11 AM

## 2019-08-27 NOTE — Progress Notes (Signed)
Labor Progress Note  Rebecca Powell is a 23 y.o. G2P1001 at [redacted]w[redacted]d admitted for active labor  Subjective: Pt is breathing through her contractions with some effort. Declines epidural.   Objective: BP 129/81 (BP Location: Left Arm)   Pulse 68   Temp 97.6 F (36.4 C) (Oral)   Resp 14   Ht 5\' 4"  (1.626 m)   Wt 78 kg   LMP 11/20/2018 (Exact Date)   BMI 29.52 kg/m    Fetal Assessment: FHT:  FHR: 145 bpm, variability: moderate,  accelerations:  Present,  decelerations:  Absent Category/reactivity:  Category I UC:   Q 1-2 min SVE:    Dilation: 8 cm  Effacement: 100%  Station:  -1  Consistency: soft  Position: anterior  Membrane status:ROM Amniotic color: Clear  Assessment / Plan: Augmentation of labor, progressing well  Labor: Progressing well with Pitocin (86mU) 0015 4/70/-2 Pitocin initiated 2145 4/70/-2 AROM'd clear fluid tolerated well Preeclampsia:  BP 133/85 Fetal Wellbeing:  Category I Pain Control:  Labor support without medication. Pt aware that medication at this point may effect baby's respiratory effort at delivery. Pt verbalizes understanding. I/D:  GBS and Covid Neg Anticipated MOD:  NSVD  Interpreter used    [employer], CNM 08/27/2019, 4:30 AM

## 2019-08-28 LAB — CBC
HCT: 26.6 % — ABNORMAL LOW (ref 36.0–46.0)
Hemoglobin: 9.2 g/dL — ABNORMAL LOW (ref 12.0–15.0)
MCH: 32.6 pg (ref 26.0–34.0)
MCHC: 34.6 g/dL (ref 30.0–36.0)
MCV: 94.3 fL (ref 80.0–100.0)
Platelets: 142 10*3/uL — ABNORMAL LOW (ref 150–400)
RBC: 2.82 MIL/uL — ABNORMAL LOW (ref 3.87–5.11)
RDW: 13.2 % (ref 11.5–15.5)
WBC: 10.3 10*3/uL (ref 4.0–10.5)
nRBC: 0 % (ref 0.0–0.2)

## 2019-08-28 NOTE — Progress Notes (Signed)
Post Partum Day 1 Subjective: no complaints, up ad lib, voiding, tolerating PO and + flatus  Objective: Blood pressure 107/74, pulse 79, temperature 98.6 F (37 C), temperature source Oral, resp. rate 16, height 5\' 4"  (1.626 m), weight 78 kg, last menstrual period 11/20/2018, SpO2 100 %, unknown if currently breastfeeding.  Physical Exam:  General: alert and cooperative Lochia: appropriate Uterine Fundus: firm Incision: n/a Lac: healing well DVT Evaluation: No evidence of DVT seen on physical exam.  Recent Labs    08/27/19 0937 08/28/19 0544  HGB 10.7* 9.2*  HCT 31.7* 26.6*    Assessment/Plan: Plan for discharge tomorrow  Breastfeeding and bottle feeding Asymptomatic anemia - Fe supplementation   LOS: 2 days   Jenifer E Wilber Fini 08/28/2019, 10:27 AM

## 2019-08-28 NOTE — Lactation Note (Signed)
This note was copied from a baby's chart. Lactation Consultation Note  Patient Name: Rebecca Powell MWNUU'V Date: 08/28/2019 Reason for consult: Follow-up assessment;Hyperbilirubinemia;Other (Comment)(Mom O+/-, [name] is B+ with (+) coombs)  Observing breast feeds more carefully today since Mom was O+ with negative coombs and [name] was B+ with positive coombs.  Mom can still easily hand express colostrum.  Mom latches [name] without assistance and she has strong, rhythmic suck with occasional audible swallows.  Mom breast fed other baby for 2 years and reports not having any challenges when breast feeding.  Mom does have a tendency to let [name] slip to tip of nipple.  Mom denies any pain with breast feeding.  Encouraged mom to keep [name] close with nose and chin touching breast for deeper latch.  After she breast fed for 20 minutes on right breast, she was still rooting.  Pointed out these feeding cues to mom and encouraged her to put [name] to the breast whenever she demonstrated these hunger cues which she did.  [name] breast fed on left breast another 30 minutes.  Through interpreter on the phone, reviewed normal newborn stomach size, supply and demand, normal course of lactation and routine newborn feeding patterns.  Lactation name and number written on white board and encouraged to call with any questions, concerns or assistance. Maternal Data Formula Feeding for Exclusion: No Has patient been taught Hand Expression?: Yes Does the patient have breastfeeding experience prior to this delivery?: Yes  Feeding Feeding Type: Breast Fed  LATCH Score Latch: Grasps breast easily, tongue down, lips flanged, rhythmical sucking.  Audible Swallowing: A few with stimulation  Type of Nipple: Everted at rest and after stimulation  Comfort (Breast/Nipple): Soft / non-tender  Hold (Positioning): No assistance needed to correctly position infant at breast.  LATCH Score:  9  Interventions Interventions: Breast massage;Hand express;Breast compression;Adjust position;Support pillows  Lactation Tools Discussed/Used [place] Program: Yes   Consult Status Consult Status: PRN    Jarold Motto 08/28/2019, 1:11 PM

## 2019-08-28 NOTE — Progress Notes (Signed)
Interpretor #[id number] used for Luellen Pucker, RN to explain 24 hr testing and vitals. All questions answered

## 2019-08-29 MED ORDER — ACETAMINOPHEN 325 MG PO TABS: 650.0000 mg | ORAL_TABLET | ORAL | Status: AC | PRN

## 2019-08-29 MED ORDER — DOCUSATE SODIUM 100 MG PO CAPS: 100.0000 mg | ORAL_CAPSULE | Freq: Two times a day (BID) | ORAL | 0 refills | Status: AC

## 2019-08-29 MED ORDER — IBUPROFEN 600 MG PO TABS: 600.0000 mg | ORAL_TABLET | Freq: Four times a day (QID) | ORAL | 0 refills | Status: AC

## 2019-08-29 NOTE — Progress Notes (Signed)
DC to home with NB.  To car via staff in [facility].

## 2019-08-29 NOTE — Progress Notes (Signed)
Pt didn't want Varicella vaccine.

## 2019-08-29 NOTE — Progress Notes (Signed)
Post Partum Day 2 Subjective: Doing well, no complaints.  Tolerating regular diet, pain with PO meds, voiding and ambulating without difficulty.  No CP SOB Fever,Chills, N/V or leg pain; denies nipple or breast pain, no HA change of vision, RUQ/epigastric pain  Objective: BP 122/81 (BP Location: Left Arm)   Pulse 74   Temp 98.6 F (37 C) (Oral)   Resp 18   Ht 5\' 4"  (1.626 m)   Wt 78 kg   LMP 11/20/2018 (Exact Date)   SpO2 99%   Breastfeeding Unknown   BMI 29.52 kg/m    Physical Exam:  General: NAD Breasts: soft/nontender CV: RRR Pulm: nl effort, CTABL Abdomen: soft, NT, BS x 4 Perineum: minimal edema, repair well approximated Lochia: moderate Uterine Fundus: fundus firm and 1 fb below umbilicus DVT Evaluation: no cords, ttp LEs   Recent Labs    08/27/19 0937 08/28/19 0544  HGB 10.7* 9.2*  HCT 31.7* 26.6*  WBC 20.5* 10.3  PLT 167 142*    Assessment/Plan: 23 y.o. [id number] postpartum day # 2  - Continue routine PP care - Lactation consult prn  - Discussed contraceptive options including implant, IUDs hormonal and non-hormonal, injection, pills/ring/patch, condoms, and NFP. Pt undecided.  - Acute blood loss anemia - hemodynamically stable and asymptomatic; continue po ferrous sulfate BID with stool softeners  - Immunization status: all Imms up to date, varicella unknown, offered varivax.     Disposition: Does desire Dc home today.     Francetta Found, CNM 08/29/2019  1:02 PM

## 2019-08-29 NOTE — Progress Notes (Signed)
DC instr reviewed with pt thru interpeteur.  Pt and FOB verb u/o.  Discussed warning s/s of when to seek medical attention or call provider.

## 2019-08-31 ENCOUNTER — Ambulatory Visit: Payer: Medicaid Other

## 2019-10-10 ENCOUNTER — Other Ambulatory Visit: Payer: Self-pay

## 2019-10-10 ENCOUNTER — Encounter: Payer: Self-pay | Admitting: Family Medicine

## 2019-10-10 ENCOUNTER — Ambulatory Visit: Payer: Medicaid Other | Admitting: Family Medicine

## 2019-10-10 DIAGNOSIS — Z3009 Encounter for other general counseling and advice on contraception: Secondary | ICD-10-CM | POA: Diagnosis not present

## 2019-10-10 LAB — HEMOGLOBIN, FINGERSTICK: Hemoglobin: 12 g/dL (ref 11.1–15.9)

## 2019-10-10 MED ORDER — THERA VITAL M PO TABS: 1.0000 | ORAL_TABLET | Freq: Every day | ORAL | 0 refills | Status: AC

## 2019-10-10 NOTE — Progress Notes (Signed)
Post Partum Exam  Rebecca Powell is a 24 y.o. [phone] female who presents for a postpartum visit. She is 6 weeks postpartum following a spontaneous vaginal delivery. I have fully reviewed the prenatal and intrapartum course. The delivery was at [redacted]w[redacted]d gestational weeks.  Anesthesia: none. Postpartum course has been going well. Baby's course has been going well. Baby is feeding by Breast Bleeding no bleeding. Bowel function is normal. Bladder function is normal. Patient is not sexually active. Contraception method is condoms.   Postpartum depression screening: Edinburgh Postnatal Depression Scale - 10/10/19 1523      Edinburgh Postnatal Depression Scale:  In the Past 7 Days   I have been able to laugh and see the funny side of things.  0    I have looked forward with enjoyment to things.  0    I have blamed myself unnecessarily when things went wrong.  0    I have been anxious or worried for no good reason.  0    I have felt scared or panicky for no good reason.  0    Things have been getting on top of me.  0    I have been so unhappy that I have had difficulty sleeping.  0    I have felt sad or miserable.  0    I have been so unhappy that I have been crying.  0    The thought of harming myself has occurred to me.  0    Edinburgh Postnatal Depression Scale Total  0        The following portions of the patient's history were reviewed and updated as appropriate: allergies, current medications, past family history, past medical history, past social history, past surgical history and problem list. Last pap smear done 02/14/19 and was Normal  Review of Systems A comprehensive review of systems was negative.    Objective:  BP 104/62   Ht 5\' 2"  (1.575 m)   Wt 154 lb 12.8 oz (70.2 kg)   LMP 11/20/2018 (Exact Date)   Breastfeeding Yes Comment: no period since birth of baby  BMI 28.31 kg/m   Gen: well appearing, NAD HEENT: no scleral icterus CV: RR Lung: Normal  WOB Breast:performed-not indicated  Ext: warm well perfused  GU: perineum laceration well healed. Grade 2-3 cystocele, weakened pelvic floor muscles.  Rectal: performed -  not indicated       Assessment:    Normal postpartum exam. Pap smear not done at today's visit.   Plan:   1. Contraception: Contraception counseling: Reviewed all forms of birth control options in the tiered based approach. available including abstinence; over the counter/barrier methods; hormonal contraceptive medication including pill, patch, ring, injection,contraceptive implant; hormonal and nonhormonal IUDs; permanent sterilization options including vasectomy and the various tubal sterilization modalities. Risks, benefits, and typical effectiveness rates were reviewed.  Questions were answered.  Written information was also given to the patient to review.  Patient desires condoms. She will follow up in  1 year for surveillance.  She was told to call with any further questions, or with any concerns about this method of contraception.  Emphasized use of condoms 100% of the time for STI prevention.  ECP: n/a  Recommended delay in pregnancy for 18 months   2. Infant feeding:  patient is currently feeding with breastmilk.  If breastmilk feeding patient was given letter for employer to provide appropriate pumping time to express breastmilk.   -Recommended patient engage with WIC/BFpeer counselors  -[place]  to sign new child up for [facility] services 3. Mood: EPDS is low risk. Reviewed resources and that mood sx in first year after pregnancy are considered related to pregnancy and to reach out for help at [facility] if needed. Discussed [facility] as link to care and availability of LCSW for counseling  4. Chronic Medical Conditions:  Anemia: checking today.  1. Postpartum exam -Grade 2-3 cystocele present on exam to evaluate laceration healing, pt is asymptomatic. PCP handout given to follow and possibly refer if she becomes symptomatic.   -Contraception counseling as above, pt elects condoms for now.  - Hemoglobin, venipuncture   Patient given handout about PCP care in the community Given MVI per family planning program guidelines and availability  Follow up in: 1 year or as needed.

## 2019-10-10 NOTE — Progress Notes (Signed)
Patient signed condom consent today. Hgb reviewed, no treatment indicated. Patient given MVI to start taking when she finishes her PNV. Marland KitchenBurt Knack, RN

## 2019-10-10 NOTE — Progress Notes (Signed)
Patient here for PP visit, needs Hgb. NSVD 08/27/19. Plans condoms for [facility].Marland KitchenBurt Knack, RN

## 2019-12-03 IMAGING — US OBSTETRIC 14+ WK ULTRASOUND
1 of 2 series · 13 of 28 positions shown · non-contrast
Comparison: none

CLINICAL DATA: Pregnancy.  Fetal anatomy evaluation.

EXAM:
OBSTETRICAL ULTRASOUND >14 WKS AND TRANSVAGINAL OB US

[Series 1: obstetric 14+ wk ultrasound · 0.24mm/px · 13 of 116 slices shown]
[im 5/116]
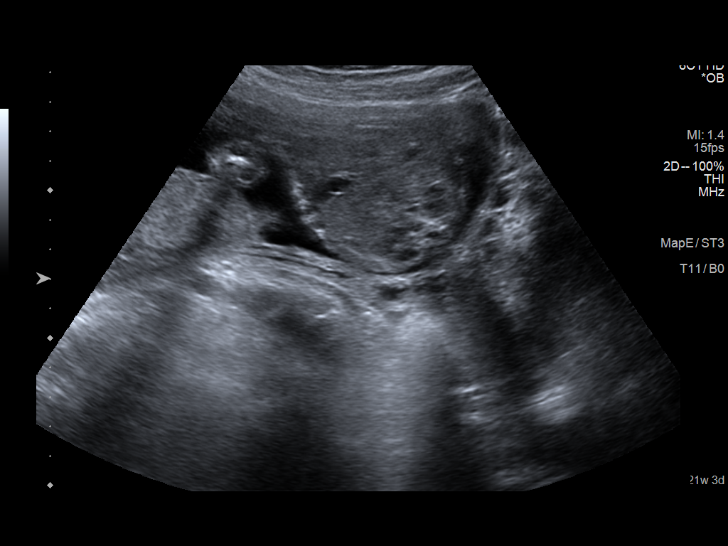
[im 14/116]
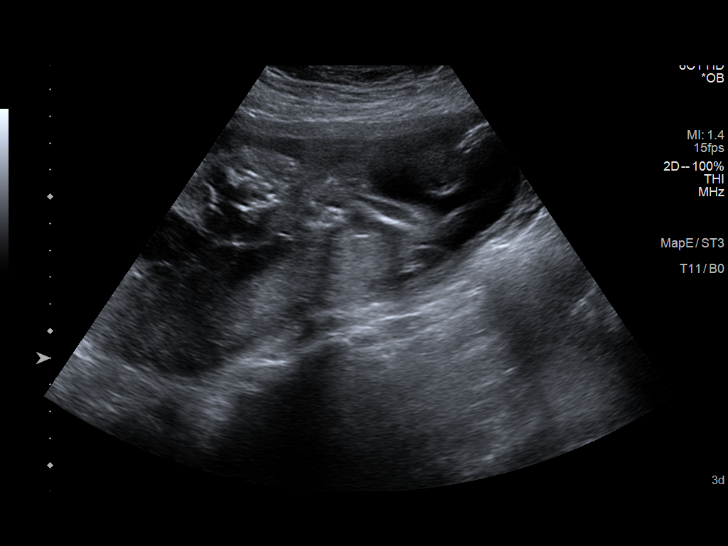
[im 23/116]
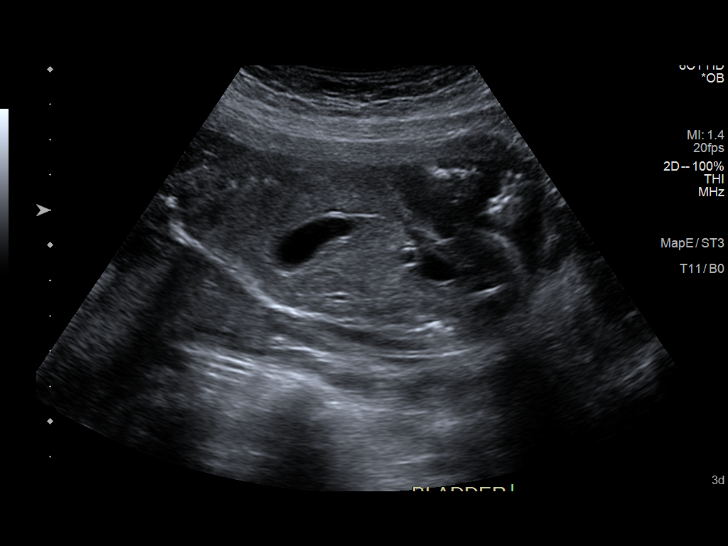
[im 31/116]
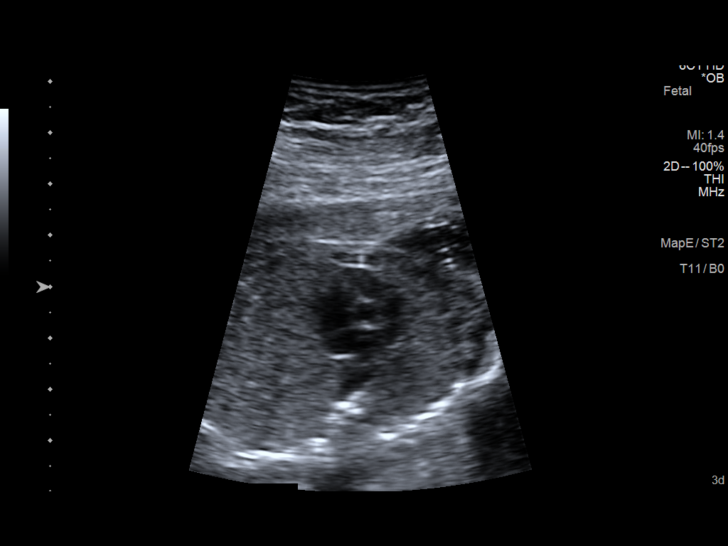
[im 40/116]
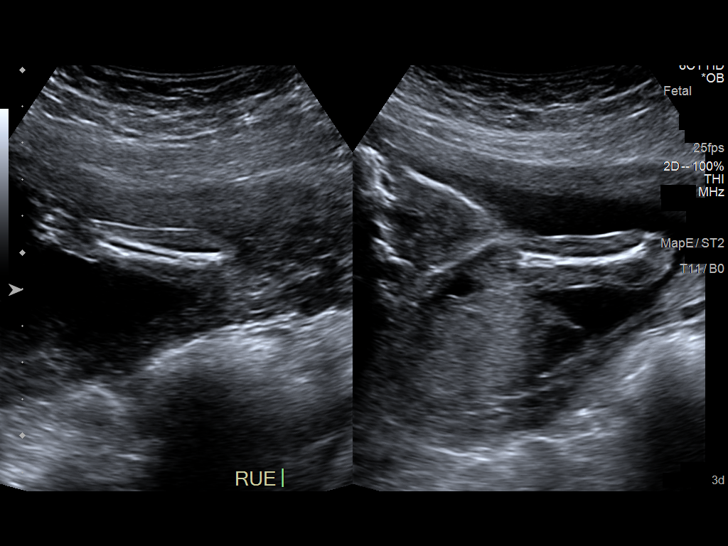
[im 49/116]
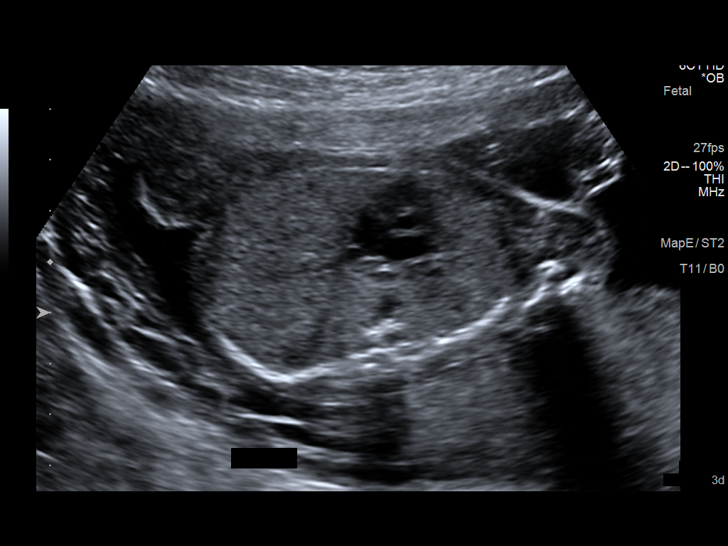
[im 62/116]
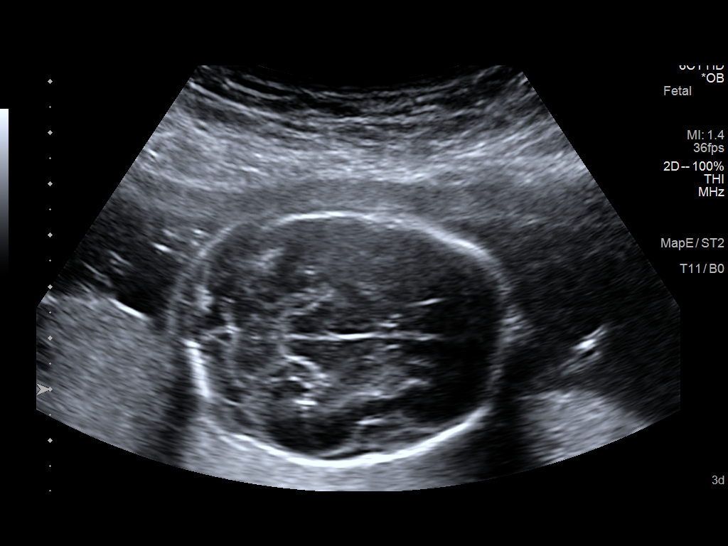
[im 71/116]
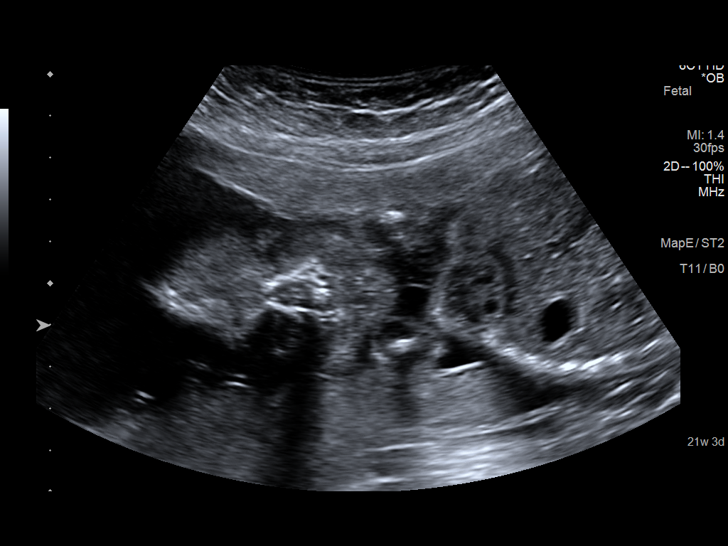
[im 80/116]
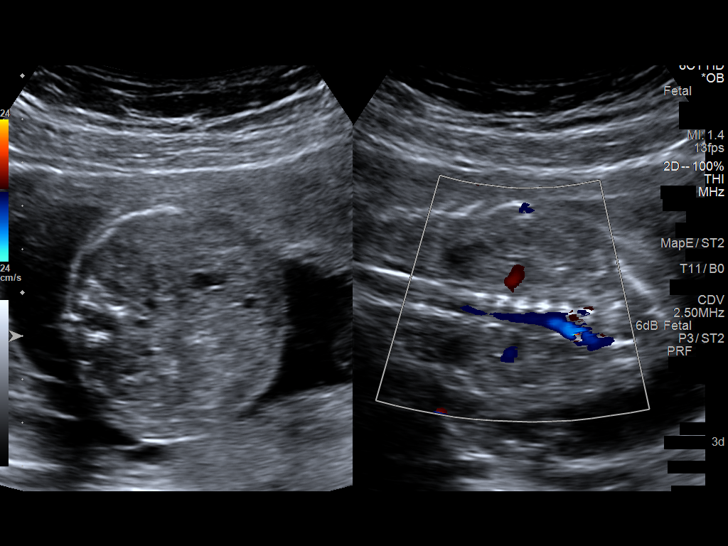
[im 89/116]
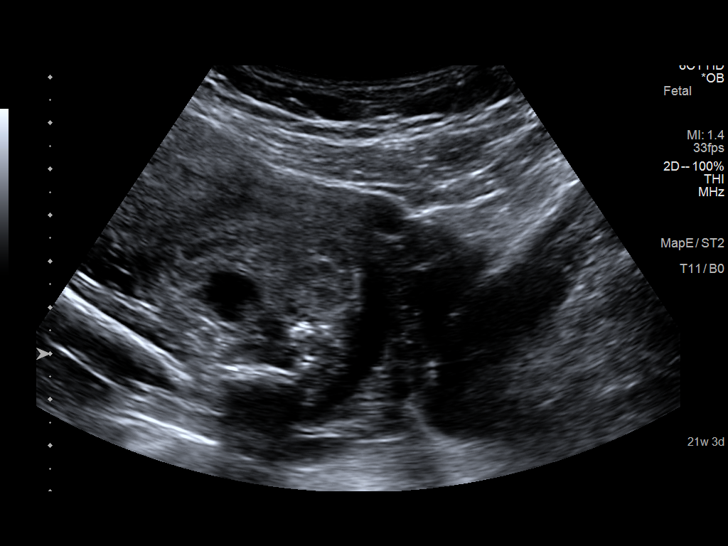
[im 98/116]
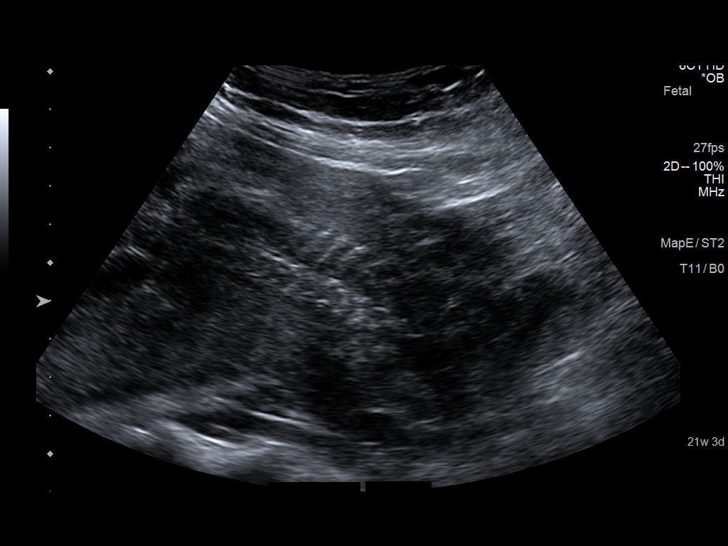
[im 107/116]
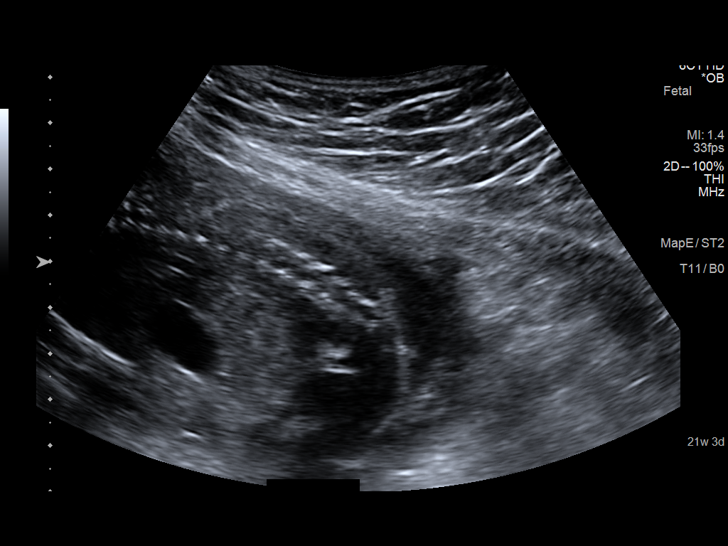
[im 116/116]
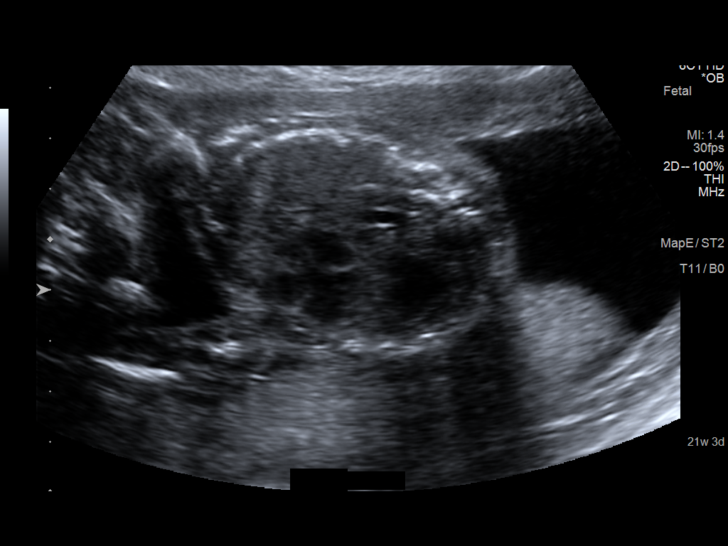

[13 of 28 positions shown; findings below may reference images not displayed]

FINDINGS: Number of Fetuses: 1

Heart Rate: 149 bpm

Movement: Present

Presentation: Breech

Previa: No

Placental Location: Posterior

Amniotic Fluid (Subjective): Normal

Amniotic Fluid (Objective):

Vertical pocket 9.2 cmcm

FETAL BIOMETRY

BPD:  5.1cm 21w 2d

HC:    19.3cm 21w 4d

AC:    16.2cm 21w 2d

FL:    3.5cm 21w 1d

Current Mean GA: 21w 3d US EDC: 08/27/2019

FETAL ANATOMY

Lateral Ventricles: Appears normal

Thalami/CSP: Appears normal

Posterior Fossa: Appears normal

Upper Lip: Appears normal

Spine: Suboptimal visualization

4 Chamber Heart on Left: Suboptimal visualization

LVOT: Not visualized

RVOT: Appears normal

Stomach on Left: Appears normal

3 Vessel Cord: Appears normal

Cord Insertion site: Limited visualization

Kidneys: Appears normal

Bladder: Appears normal

Extremities: Appears

Technical Limitations: Fetal position

Maternal Findings:

Cervix:  3.9 cm and closed
IMPRESSION: 1. Single viable intrauterine pregnancy at 21 weeks 3 days. Breech
presentation.

2. Suboptimal visualization of the spine, heart, and cord insertion
site secondary to fetal position. Follow-up exam suggested for
further evaluation.
